# Patient Record
Sex: Female | Born: 1945
Health system: Southern US, Community
[De-identification: ages and names within clinical notes are randomized; demographics above are authoritative.]

## PROBLEM LIST (undated history)

## (undated) DIAGNOSIS — M1990 Unspecified osteoarthritis, unspecified site: Secondary | ICD-10-CM

## (undated) DIAGNOSIS — IMO0002 Reserved for concepts with insufficient information to code with codable children: Secondary | ICD-10-CM

## (undated) DIAGNOSIS — N63 Unspecified lump in unspecified breast: Secondary | ICD-10-CM

## (undated) DIAGNOSIS — M810 Age-related osteoporosis without current pathological fracture: Secondary | ICD-10-CM

## (undated) HISTORY — DX: Unspecified lump in unspecified breast: N63.0

## (undated) HISTORY — PX: COLONOSCOPY: SHX174

## (undated) HISTORY — DX: Age-related osteoporosis without current pathological fracture: M81.0

## (undated) HISTORY — PX: BREAST SURGERY: SHX581

## (undated) HISTORY — DX: Reserved for concepts with insufficient information to code with codable children: IMO0002

## (undated) HISTORY — PX: WISDOM TOOTH EXTRACTION: SHX21

## (undated) HISTORY — PX: POLYPECTOMY: SHX149

---

## 1970-07-29 HISTORY — PX: OTHER SURGICAL HISTORY: SHX169

## 1979-03-30 HISTORY — PX: NASAL SEPTUM SURGERY: SHX37

## 1997-12-08 ENCOUNTER — Other Ambulatory Visit: Admission: RE | Admit: 1997-12-08 | Discharge: 1997-12-08 | Payer: Self-pay | Admitting: Obstetrics and Gynecology

## 1999-03-01 ENCOUNTER — Other Ambulatory Visit: Admission: RE | Admit: 1999-03-01 | Discharge: 1999-03-01 | Payer: Self-pay | Admitting: Obstetrics and Gynecology

## 1999-04-10 ENCOUNTER — Other Ambulatory Visit: Admission: RE | Admit: 1999-04-10 | Discharge: 1999-04-10 | Payer: Self-pay | Admitting: Internal Medicine

## 2000-03-04 ENCOUNTER — Other Ambulatory Visit: Admission: RE | Admit: 2000-03-04 | Discharge: 2000-03-04 | Payer: Self-pay | Admitting: Obstetrics and Gynecology

## 2001-03-11 ENCOUNTER — Other Ambulatory Visit: Admission: RE | Admit: 2001-03-11 | Discharge: 2001-03-11 | Payer: Self-pay | Admitting: Obstetrics and Gynecology

## 2002-03-12 ENCOUNTER — Other Ambulatory Visit: Admission: RE | Admit: 2002-03-12 | Discharge: 2002-03-12 | Payer: Self-pay | Admitting: Obstetrics and Gynecology

## 2003-03-14 ENCOUNTER — Other Ambulatory Visit: Admission: RE | Admit: 2003-03-14 | Discharge: 2003-03-14 | Payer: Self-pay | Admitting: Obstetrics and Gynecology

## 2004-03-14 ENCOUNTER — Other Ambulatory Visit: Admission: RE | Admit: 2004-03-14 | Discharge: 2004-03-14 | Payer: Self-pay | Admitting: Obstetrics and Gynecology

## 2005-04-19 ENCOUNTER — Other Ambulatory Visit: Admission: RE | Admit: 2005-04-19 | Discharge: 2005-04-19 | Payer: Self-pay | Admitting: Obstetrics and Gynecology

## 2007-05-29 ENCOUNTER — Other Ambulatory Visit: Admission: RE | Admit: 2007-05-29 | Discharge: 2007-05-29 | Payer: Self-pay | Admitting: Obstetrics and Gynecology

## 2008-06-03 ENCOUNTER — Other Ambulatory Visit: Admission: RE | Admit: 2008-06-03 | Discharge: 2008-06-03 | Payer: Self-pay | Admitting: Obstetrics and Gynecology

## 2009-04-11 ENCOUNTER — Encounter (INDEPENDENT_AMBULATORY_CARE_PROVIDER_SITE_OTHER): Payer: Self-pay | Admitting: *Deleted

## 2009-05-19 ENCOUNTER — Ambulatory Visit: Payer: Self-pay | Admitting: Internal Medicine

## 2009-06-02 ENCOUNTER — Ambulatory Visit: Payer: Self-pay | Admitting: Internal Medicine

## 2009-06-02 ENCOUNTER — Encounter: Payer: Self-pay | Admitting: Internal Medicine

## 2009-06-05 ENCOUNTER — Encounter: Payer: Self-pay | Admitting: Internal Medicine

## 2014-03-31 ENCOUNTER — Encounter: Payer: Self-pay | Admitting: Internal Medicine

## 2014-04-07 ENCOUNTER — Encounter: Payer: Self-pay | Admitting: Internal Medicine

## 2014-04-18 ENCOUNTER — Encounter: Payer: Self-pay | Admitting: Obstetrics and Gynecology

## 2014-04-18 ENCOUNTER — Ambulatory Visit (INDEPENDENT_AMBULATORY_CARE_PROVIDER_SITE_OTHER): Payer: Medicare Other | Admitting: Obstetrics and Gynecology

## 2014-04-18 VITALS — BP 118/70 | HR 60 | Resp 14 | Ht 64.5 in | Wt 127.8 lb

## 2014-04-18 DIAGNOSIS — N950 Postmenopausal bleeding: Secondary | ICD-10-CM

## 2014-04-18 NOTE — Progress Notes (Signed)
Patient ID: Christine Romero, female   DOB: 06/30/1946, 68 y.o.   MRN: [id number] GYNECOLOGY VISIT  PCP:  Lavone Orn, MD  Referring provider:   HPI: 68 y.o.   Married  caucasian  female   [id number] with Patient's last menstrual period was 07/30/1983.   here for postmenopausal bleeding. Bled most of September and then became heavy on 04/09/14.  Seen at [facility] walk in clinic and was referred to this office.  Hgb 14 on 04/09/14. Patient takes HRT - Provera 5 mg days 1 - 13 and Estradiol 1 mg daily.  Taking HRT since her 41s. No prior bleeding in her menopause.  Denies pain.  No prior history of abnormal paps or mammograms.  No known fibroids.   GYNECOLOGIC HISTORY: Patient's last menstrual period was 07/30/1983. Sexually active:  no Partner preference: female Contraception:  postmenopausal Menopausal hormone therapy: Provera 5mg , Estradiol 0.5mg  DES exposure:no  Blood transfusions: no   Sexually transmitted diseases:   no GYN procedures and prior surgeries: no  Last mammogram: ?2014 [facility].               Last pap and high risk HPV testing:  4-6 years ago:wnl History of abnormal pap smear: no   OB History   Grav Para Term Preterm Abortions TAB SAB Ect Mult Living   3 3 3       3        LIFESTYLE: Exercise:  Gardening, walking            Tobacco:   no Alcohol:     2 drinks per month Drug use:  no  There are no active problems to display for this patient.   Past Medical History  Diagnosis Date  . Dyspareunia   . Osteoporosis     Past Surgical History  Procedure Laterality Date  . Breast surgery      breast augmentation  . Nasal septum surgery  1980's    Current Outpatient Prescriptions  Medication Sig Dispense Refill  . calcium carbonate (OS-CAL) 600 MG TABS tablet Take 600 mg by mouth 2 (two) times daily with a meal.      . estradiol (ESTRACE) 0.5 MG tablet Take 0.5 mg by mouth daily.      . Multiple Vitamin (MULTIVITAMIN) capsule Take 1 capsule by mouth daily.       . medroxyPROGESTERone (PROVERA) 5 MG tablet Take 5 mg by mouth as directed. Day 1-13 of each month      . valACYclovir (VALTREX) 1000 MG tablet Take 1 g by mouth as needed. Takes as needed for oral cold sores       No current facility-administered medications for this visit.     ALLERGIES: Pentazocine lactate  Family History  Problem Relation Age of Onset  . Stroke Mother 79    dec  . Diabetes Mother   . Cancer Father 45    dec colon ca  . Other Brother 30    dec-MVA  . Multiple sclerosis Sister   . Multiple sclerosis Sister   . Colitis Sister   . Stroke Maternal Aunt     History   Social History  . Marital Status: Married    Spouse Name: N/A    Number of Children: N/A  . Years of Education: N/A   Occupational History  . Not on file.   Social History Main Topics  . Smoking status: Never Smoker   . Smokeless tobacco: Not on file  . Alcohol Use: Yes  Comment: 2 drinks per month  . Drug Use: No  . Sexual Activity: Yes    Partners: Male    Birth Control/ Protection: Post-menopausal     Comment: postmenopausal   Other Topics Concern  . Not on file   Social History Narrative  . No narrative on file    ROS:  Pertinent items are noted in HPI.  PHYSICAL EXAMINATION:    BP 118/70  Pulse 60  Resp 14  Ht 5' 4.5" (1.638 m)  Wt 127 lb 12.8 oz (57.97 kg)  BMI 21.61 kg/m2  LMP 07/30/1983   Wt Readings from Last 3 Encounters:  04/18/14 127 lb 12.8 oz (57.97 kg)     Ht Readings from Last 3 Encounters:  04/18/14 5' 4.5" (1.638 m)    General appearance: alert, cooperative and appears stated age Head: Normocephalic, without obvious abnormality, atraumatic Neck: no adenopathy, supple, symmetrical, trachea midline and thyroid not enlarged, symmetric, no tenderness/mass/nodules Lungs: clear to auscultation bilaterally Breasts:  Consistent with bilateral implants, Inspection negative, No nipple retraction or dimpling, No nipple discharge or bleeding, No  axillary or supraclavicular adenopathy, Normal to palpation without dominant masses Heart: regular rate and rhythm Abdomen: soft, non-tender; no masses,  no organomegaly Extremities: extremities normal, atraumatic, no cyanosis or edema Skin: Skin color, texture, turgor normal. No rashes or lesions Lymph nodes: Cervical, supraclavicular, and axillary nodes normal. No abnormal inguinal nodes palpated Neurologic: Grossly normal  Pelvic: External genitalia:  no lesions              Urethra:  normal appearing urethra with no masses, tenderness or lesions              Bartholins and Skenes: normal                 Vagina: normal appearing vagina with normal color and discharge, no lesions              Cervix: normal appearance                 Bimanual Exam:  Uterus:  uterus is normal size, shape, consistency and nontender                                      Adnexa: normal adnexa in size, nontender and no masses                                      Rectovaginal: Confirms                                      Anus:  normal sphincter tone, no lesions  Procedure - endometrial biopsy Consent performed. Speculum place in vagina.  Sterile prep of cervix with betadine.  Pipelle placed to   7       cm without difficulty twice. Tissue obtained and sent to pathology. Speculum removed.  No complications.  Given Coke after procedure.  Declined ibuprofen.   ASSESSMENT  Postmenopausal bleeding. On HRT - cyclic monthly.   PLAN  Pap today.  Endometrial biopsy. Return for pelvic ultrasound.    An After Visit Summary was printed and given to the patient.  30 minutes face to face time of which over 50% was spent in  counseling.

## 2014-04-18 NOTE — Patient Instructions (Signed)
Endometrial Biopsy, Care After Refer to this sheet in the next few weeks. These instructions provide you with information on caring for yourself after your procedure. Your health care provider may also give you more specific instructions. Your treatment has been planned according to current medical practices, but problems sometimes occur. Call your health care provider if you have any problems or questions after your procedure. WHAT TO EXPECT AFTER THE PROCEDURE After your procedure, it is typical to have the following:  You may have mild cramping and a small amount of vaginal bleeding for a few days after the procedure. This is normal. HOME CARE INSTRUCTIONS  Only take over-the-counter or prescription medicine as directed by your health care provider.  Do not douche, use tampons, or have sexual intercourse until your health care provider approves.  Follow your health care provider's instructions regarding any activity restrictions, such as strenuous exercise or heavy lifting. SEEK MEDICAL CARE IF:  You have heavy bleeding or bleeding longer than 2 days after the procedure.  You have bad smelling drainage from your vagina.  You have a fever and chills.  Youhave severe lower stomach (abdominal) pain. SEEK IMMEDIATE MEDICAL CARE IF:  You have severe cramps in your stomach or back.  You pass large blood clots.  Your bleeding increases.  You become weak or lightheaded, or you pass out. Document Released: 05/05/2013 Document Reviewed: 05/05/2013 ExitCare Patient Information 2015 [employer]. This information is not intended to replace advice given to you by your health care provider. Make sure you discuss any questions you have with your health care provider.  

## 2014-04-20 LAB — IPS OTHER TISSUE BIOPSY

## 2014-04-20 LAB — IPS PAP SMEAR ONLY

## 2014-04-21 ENCOUNTER — Encounter: Payer: Self-pay | Admitting: Obstetrics and Gynecology

## 2014-04-21 ENCOUNTER — Telehealth: Payer: Self-pay | Admitting: Obstetrics and Gynecology

## 2014-04-21 ENCOUNTER — Ambulatory Visit (INDEPENDENT_AMBULATORY_CARE_PROVIDER_SITE_OTHER): Payer: Medicare Other

## 2014-04-21 ENCOUNTER — Ambulatory Visit (INDEPENDENT_AMBULATORY_CARE_PROVIDER_SITE_OTHER): Payer: Medicare Other | Admitting: Obstetrics and Gynecology

## 2014-04-21 VITALS — BP 100/60 | HR 60 | Ht 64.5 in | Wt 127.0 lb

## 2014-04-21 DIAGNOSIS — N950 Postmenopausal bleeding: Secondary | ICD-10-CM

## 2014-04-21 DIAGNOSIS — D3910 Neoplasm of uncertain behavior of unspecified ovary: Secondary | ICD-10-CM

## 2014-04-21 NOTE — Progress Notes (Signed)
Subjective  Patient is here for pelvic ultrasound for postmenopausal bleeding.  Patient having post menopausal bleeding most of the month of September.  Was never clotting.  Patient is taking progesterone 13 days per month.  Takes estrogen daily.  Been on this regimen since her 49s.  Going to [place] on May 02, 2014.  Returns May 11, 2014.   EMB 04/18/14 - benign endometrium.   Objective  See pelvic ultrasound below - images and reports reviewed with patient.   Uterus - EMS 4.37 mm. Uterus with no myometrial masses.  Possible endocervical mass 8 x 6 mm.  Left ovary with collapsed and calcified corpus luteum cyst.  No free fluid.     Assessment  Postmenopausal bleeding.  Possible endocervical mass. Left corpus luteum cyst.   Plan  CA125.  Proceed with hysteroscopy with dilation and curettage.  Risks, benefits, and alternatives discussed with patient who wishes to proceed. Discussed risks which include but are not limited to  bleeding, infection, damage to surrounding organs, uterine perforation requiring laparoscopy and hospital observation, reaction to anesthesia, pneumonia, DVT, PE.  Will do after returns from [place].  We discussed the pros and cons of doing surgery prior to or after travel.  After visit summary to patient.   25 minutes face to face time of which over 50% was spent in counseling.

## 2014-04-21 NOTE — Telephone Encounter (Signed)
Spoke with patient. Advised will be responsible for $93.08 per [place]. Patient is agreeable and verbalizes understanding.  Routing to provider for final review. Patient agreeable to disposition. Will close encounter

## 2014-04-21 NOTE — Telephone Encounter (Signed)
Spoke with patient. Advised patient that she is to return for a PUS but it has not yet been precerted. Patient would like to schedule for today and have a call back about benefits. "I do not want to wait until next week." Appointment scheduled for today at 10am with 10:30am consult with Dr.Silva. Patient agreeable to date and time. Advised will have billing check coverage and we will return call with further information. Patient is agreeable.  Cc: Christine Romero for precert

## 2014-04-21 NOTE — Telephone Encounter (Signed)
Patient is waiting on a return call from [place]. Patient thinks she is supposed to return for another office visit today. No appointment scheduled.

## 2014-04-21 NOTE — Patient Instructions (Signed)
We will call to schedule a hysteroscopic polypectomy with dilation and curettage.

## 2014-04-21 NOTE — Telephone Encounter (Signed)
Left message to call [employer] at [phone].  Patient is to return for PUS. PUS has not yet been precerted. Needs to be scheduled.

## 2014-04-22 ENCOUNTER — Telehealth: Payer: Self-pay | Admitting: Obstetrics and Gynecology

## 2014-04-22 LAB — CA 125: CA 125: 12 U/mL (ref <35)

## 2014-04-22 NOTE — Telephone Encounter (Signed)
Patient calling re: D&C after 05/11/14 plan. She is "having second thoughts about waiting that long" and wants to speak with nurse for advice.

## 2014-04-22 NOTE — Telephone Encounter (Signed)
I now see her normal CA 125 result which I can provide to her.

## 2014-04-22 NOTE — Telephone Encounter (Signed)
Routing to Sally Yeakley, RN for surgery scheduling. 

## 2014-04-22 NOTE — Telephone Encounter (Signed)
Reviewed with Dr Quincy Simmonds. Patient does not need to cancel trip. Can offer 04-26-14 if desires. Otherwise, ok to wait until 05-16-14 after trip. Patient advised of normal CA125 results. Decides would prefer to wait until 05-16-14. Advised will schedule and call her back to confirm.

## 2014-04-22 NOTE — Telephone Encounter (Signed)
Return call to patient. Has been thinking overnight and is now worried that she should wait until return from trip to [place].  Wants to speak with Dr Quincy Simmonds to see if she should cancel trip. Advised dr Quincy Simmonds is with patients. I feel like if she felt it was that urgent, she would have told her that yesterday but will review with her and call her back. Advised we have minimal, if any, availability for surgery before 05-16-14. Please advise.

## 2014-04-23 NOTE — Telephone Encounter (Signed)
OK to wait until after patient returns from her trip to do her surgery.

## 2014-04-25 ENCOUNTER — Telehealth: Payer: Self-pay

## 2014-04-25 NOTE — Telephone Encounter (Signed)
Message copied by Robley Fries on Mon Apr 25, 2014 10:56 AM ------      Message from: Judeth Horn DE Berton Lan, BROOK E      Created: Fri Apr 22, 2014  1:16 PM       Triage,            Please let patient know that the CA125 is totally normal.       This was done due to a cyst seen on the ovary yesterday at her ultrasound for postmenopausal bleeding.       She will have upcoming hysteroscopy with dilation and curettage for an endocervical polyp.             Cc- Lamont Snowball ------

## 2014-04-25 NOTE — Telephone Encounter (Signed)
Patient notified of results.//kn 

## 2014-04-25 NOTE — Telephone Encounter (Signed)
Pt is calling kelly back

## 2014-04-25 NOTE — Telephone Encounter (Signed)
Lmtcb//kn 

## 2014-04-29 NOTE — Telephone Encounter (Signed)
Call to patient. Confirmed surgery date of 05-16-14 at 1100 at [facility]. Surgery instruction sheet reviewed and printed copy mailed. Surgery appointments scheduled.  Patient states PCP advised her to take ASA 81 mg on her flights to and from [place]. Confirmed with Dr Quincy Simmonds that ASA products are not recommended for 14 days prior to surgery. Recommended compression stockings and leg exercises during flights. Recommend she stop all herbal products that she uses for sleep, diphenhydramine is ok but should not take melatonin ot valerian root.  Routing to provider for final review. Patient agreeable to disposition. Will close encounter

## 2014-05-10 NOTE — Patient Instructions (Addendum)
   Your procedure is scheduled on:  Monday, Oct 19  Enter through the Main Entrance of [facility] at: 6 AM Pick up the phone at the desk and dial [phone] and inform us of your arrival.  Please call this number if you have any problems the morning of surgery: [phone]  Remember: Do not eat or drink after midnight: Sunday Take these medicines the morning of surgery with a SIP OF WATER:  None  Do not wear jewelry, make-up, or FINGER nail polish No metal in your hair or on your body. Do not wear lotions, powders, perfumes.  You may wear deodorant.  Do not bring valuables to the hospital. Contacts, dentures or bridgework may not be worn into surgery.  Patients discharged on the day of surgery will not be allowed to drive home.  Home with husband Christine Romero

## 2014-05-12 ENCOUNTER — Other Ambulatory Visit: Payer: Self-pay

## 2014-05-12 ENCOUNTER — Encounter (HOSPITAL_COMMUNITY)
Admission: RE | Admit: 2014-05-12 | Discharge: 2014-05-12 | Disposition: A | Discharge status: Home / Self Care | Payer: Medicare Other | Source: Ambulatory Visit | Attending: Obstetrics and Gynecology | Admitting: Obstetrics and Gynecology

## 2014-05-12 ENCOUNTER — Telehealth: Payer: Self-pay | Admitting: *Deleted

## 2014-05-12 ENCOUNTER — Encounter (HOSPITAL_COMMUNITY): Payer: Self-pay | Admitting: Pharmacist

## 2014-05-12 ENCOUNTER — Encounter (HOSPITAL_COMMUNITY): Payer: Self-pay

## 2014-05-12 DIAGNOSIS — Z01812 Encounter for preprocedural laboratory examination: Secondary | ICD-10-CM | POA: Insufficient documentation

## 2014-05-12 HISTORY — DX: Unspecified osteoarthritis, unspecified site: M19.90

## 2014-05-12 LAB — CBC
HCT: 39.3 % (ref 36.0–46.0)
HEMOGLOBIN: 13.7 g/dL (ref 12.0–15.0)
MCH: 30.4 pg (ref 26.0–34.0)
MCHC: 34.9 g/dL (ref 30.0–36.0)
MCV: 87.3 fL (ref 78.0–100.0)
PLATELETS: 211 10*3/uL (ref 150–400)
RBC: 4.5 MIL/uL (ref 3.87–5.11)
RDW: 12.4 % (ref 11.5–15.5)
WBC: 8.4 10*3/uL (ref 4.0–10.5)

## 2014-05-12 LAB — BASIC METABOLIC PANEL
Anion gap: 11 (ref 5–15)
BUN: 11 mg/dL (ref 6–23)
CALCIUM: 9.4 mg/dL (ref 8.4–10.5)
CO2: 27 mEq/L (ref 19–32)
Chloride: 104 mEq/L (ref 96–112)
Creatinine, Ser: 0.85 mg/dL (ref 0.50–1.10)
GFR, EST AFRICAN AMERICAN: 79 mL/min — AB (ref >90)
GFR, EST NON AFRICAN AMERICAN: 68 mL/min — AB (ref >90)
Glucose, Bld: 100 mg/dL — ABNORMAL HIGH (ref 70–99)
Potassium: 3.9 mEq/L (ref 3.7–5.3)
Sodium: 142 mEq/L (ref 137–147)

## 2014-05-12 NOTE — Telephone Encounter (Signed)
Patient is scheduled for colonoscopy on 06-08-14 and wanted to be sure surgery date on 05-31-14 was ok with scheduled colonoscopy. Advised that 24-48 hour recovery should be all that is needed after this procedure but will review with Dr Quincy Simmonds and confirm. Please advise.

## 2014-05-12 NOTE — Telephone Encounter (Signed)
Arbie Cookey at [facility]. Patient is there for pre op appointment and has cold symptoms and laryngitis and anesthsia feels her surgery should be canceled for at least two weeks. She has been traveling and is not well enough to undergo an elective surgical procedure per Dr Rudean Curt. They will complete appointment now and if we reschedule procedure within 30 days she will not need another pre-op. Advised we will have to look at surgery schedule and call patient back. Novemeber 3rd may be an option if she is well enough.

## 2014-05-12 NOTE — Pre-Procedure Instructions (Signed)
Spoke with Dr. Rudean Curt, patient has just arrived back from vacation in [place].  Patient has had cold sx, cough and laryngitis for the past 3-4 days.  No fever.  Dr Glennon Mac wants patient to wait 2 weeks for her surgery (an elective surgery).  Called Dr Elza Rafter office and spoke with Gay Filler who will reschedule surgery 2 weeks out.

## 2014-05-12 NOTE — Telephone Encounter (Signed)
Pts husband is calling to speak with sally about rescheduling the surgery due to pt being sick.

## 2014-05-12 NOTE — Telephone Encounter (Signed)
The two procedure dates should be fine for the patient.

## 2014-05-12 NOTE — Telephone Encounter (Signed)
Call to patient, voice barely audible so she is on speaker phone with husband to answer questions for her. Agrees to new date of 05-31-14 at 1200. Surgical appointments rescheduled based on new date. Reviewed surgical instruction sheet and will mail printed copy with new dates. Discussed may drink herbal tea and have motrin for her current cold symptoms until 05-19-14 (2 weeks before new procedure). Encouraged rest and hydration, wished her speedy recovery.   Routing to provider for final review. Patient agreeable to disposition. Will close encounter

## 2014-05-12 NOTE — Telephone Encounter (Signed)
First available date to reschedule is 05-31-14. Case reschedule to 05-31-14 at 1200.  Call to patient, LMTCB.

## 2014-05-13 ENCOUNTER — Ambulatory Visit: Payer: Medicare Other | Admitting: Obstetrics and Gynecology

## 2014-05-13 ENCOUNTER — Other Ambulatory Visit: Payer: Self-pay

## 2014-05-13 NOTE — Telephone Encounter (Signed)
Call to patient. Notified that Dr Quincy Simmonds reviewed dates of both procedures and this is not a conflict.

## 2014-05-18 NOTE — Pre-Procedure Instructions (Signed)
Called patient at home.  Re-instructed patient with new surgery date and time change.  Surgery now scheduled for 05/31/14 at 12 noon.  Patient instructed to be here at 1030 AM.  No solid foods after midnight Monday night, 05/30/14.  Clear liquids til 8 AM Tuesday, day of surgery.  Nothing by mouth after 8 AM Tuesday, DOS, 05/31/14.

## 2014-05-25 ENCOUNTER — Ambulatory Visit (AMBULATORY_SURGERY_CENTER): Payer: Self-pay | Admitting: *Deleted

## 2014-05-25 VITALS — Ht 64.5 in | Wt 127.0 lb

## 2014-05-25 DIAGNOSIS — Z86010 Personal history of colonic polyps: Secondary | ICD-10-CM

## 2014-05-25 NOTE — Progress Notes (Signed)
No egg or soy allergy. ewm No home 02 use. ewm No diet pills, no blood thinners. ewm No issues with past sedation. ewm emmi video to pt's e mail. ewm Pt wants to do miralax prep as her husband is doing that with his colon the same time.  ewm

## 2014-05-26 ENCOUNTER — Ambulatory Visit: Payer: Medicare Other | Admitting: Obstetrics and Gynecology

## 2014-05-26 ENCOUNTER — Encounter: Payer: Self-pay | Admitting: Obstetrics and Gynecology

## 2014-05-26 ENCOUNTER — Ambulatory Visit (INDEPENDENT_AMBULATORY_CARE_PROVIDER_SITE_OTHER): Payer: Medicare Other | Admitting: Obstetrics and Gynecology

## 2014-05-26 VITALS — BP 130/76 | HR 66 | Ht 64.5 in | Wt 129.8 lb

## 2014-05-26 DIAGNOSIS — N950 Postmenopausal bleeding: Secondary | ICD-10-CM

## 2014-05-26 NOTE — Progress Notes (Signed)
Patient ID: Christine Romero, female   DOB: [birth year], 68 y.o.   MRN: [id number] GYNECOLOGY  VISIT   HPI: 68 y.o.   Married  Caucasian  female   [phone] with Patient's last menstrual period was 07/30/1983.   here for evaluation of upcoming surgery.   Patient with postmenopausal bleeding.  Bleeding has resolved.   Patient had post menopausal bleeding most of the month of September. Was never clotting.  Patient is taking progesterone 13 days per month.  Takes estrogen daily.  Been on this regimen since her 32s.   EMB 04/18/14 - benign endometrium.   See pelvic ultrasound 04/21/14: Uterus - EMS 4.37 mm. Uterus with no myometrial masses. Possible endocervical mass 8 x 6 mm. Left ovary with collapsed and calcified corpus luteum cyst.  No free fluid.   Normal CA125. Normal pap.  Patient just recovering from bronchitis.  Taking Mucinex and Tussionex.  GYNECOLOGIC HISTORY: Patient's last menstrual period was 07/30/1983. Contraception: postmenopausal   Menopausal hormone therapy: Estradiol 1 mg, Provera 5 mg.         OB History   Grav Para Term Preterm Abortions TAB SAB Ect Mult Living   3 3 3       3          Patient Active Problem List   Diagnosis Date Noted  . Postmenopausal bleeding 04/18/2014    Past Medical History  Diagnosis Date  . Dyspareunia   . Osteoporosis   . SVD (spontaneous vaginal delivery)     x 3  . Arthritis     hands    Past Surgical History  Procedure Laterality Date  . Breast surgery      breast augmentation  . Nasal septum surgery  1980's  . Wisdom tooth extraction    . Colonoscopy    . Polypectomy    . Gardners duct surgery  1972    Current Outpatient Prescriptions  Medication Sig Dispense Refill  . chlorpheniramine-HYDROcodone (TUSSIONEX) 10-8 MG/5ML LQCR Take 5 mLs by mouth every 12 (twelve) hours as needed for cough.      . diphenhydrAMINE (BENADRYL) 25 mg capsule Take 25-50 mg by mouth at bedtime as needed for sleep.      Marland Kitchen estradiol  (ESTRACE) 1 MG tablet Take 1 mg by mouth daily.      . medroxyPROGESTERone (PROVERA) 5 MG tablet Take 5 mg by mouth as directed. Day 1-13 of each month      . Phenylephrine-DM-GG-APAP (MUCINEX SINUS-MAX) 5-10-200-325 MG CAPS Take 1 capsule by mouth as needed.      . calcium carbonate (OS-CAL) 600 MG TABS tablet Take 600 mg by mouth 2 (two) times daily with a meal.      . DM-Doxylamine-Acetaminophen (NYQUIL COLD & FLU PO) Take 30 mLs by mouth every 4 (four) hours as needed (cold & flu symptoms).      . Multiple Vitamins-Minerals (MULTIVITAMIN WITH MINERALS) tablet Take 1 tablet by mouth daily.      . valACYclovir (VALTREX) 1000 MG tablet Take 1 g by mouth as needed. Takes as needed for oral cold sores       No current facility-administered medications for this visit.     ALLERGIES: Talwin  Family History  Problem Relation Age of Onset  . Stroke Mother 69    dec  . Diabetes Mother   . Cancer Father 73    dec colon ca  . Colon cancer Father   . Other Brother 30    dec-MVA  .  Multiple sclerosis Sister   . Multiple sclerosis Sister   . Colitis Sister   . Stroke Maternal Aunt   . Rectal cancer Neg Hx   . Stomach cancer Neg Hx     History   Social History  . Marital Status: Married    Spouse Name: N/A    Number of Children: N/A  . Years of Education: N/A   Occupational History  . Not on file.   Social History Main Topics  . Smoking status: Current Some Day Smoker -- 0.25 packs/day    Types: Cigarettes  . Smokeless tobacco: Never Used  . Alcohol Use: Yes     Comment: socially  . Drug Use: No  . Sexual Activity: Yes    Partners: Male    Birth Control/ Protection: Post-menopausal     Comment: postmenopausal   Other Topics Concern  . Not on file   Social History Narrative  . No narrative on file    ROS:  Pertinent items are noted in HPI.  PHYSICAL EXAMINATION:    BP 130/76  Pulse 66  Ht 5' 4.5" (1.638 m)  Wt 129 lb 12.8 oz (58.877 kg)  BMI 21.94 kg/m2  LMP  07/30/1983     General appearance: alert, cooperative and appears stated age Lungs: clear to auscultation bilaterally Heart: regular rate and rhythm Abdomen: soft, non-tender; no masses,  no organomegaly No abnormal inguinal nodes palpated  Pelvic: External genitalia:  no lesions              Urethra:  normal appearing urethra with no masses, tenderness or lesions              Bartholins and Skenes: normal                 Vagina: normal appearing vagina with normal color and discharge, no lesions              Cervix: normal appearance                   Bimanual Exam:  Uterus:  uterus is normal size, shape, consistency and nontender                                      Adnexa: normal adnexa in size, nontender and no masses                                     ASSESSMENT  Postmenopausal bleeding.  Calcified left ovary.  Normal CA125.  PLAN  Proceed with hysteroscopy with polypectomy, dilation and curettage. Risks, benefits, and alternatives discussed with the patient who wishes to proceed.  Pelvic ultrasound in January 2016 to recheck ovary.    An After Visit Summary was printed and given to the patient.  _15_____ minutes face to face time of which over 50% was spent in counseling.

## 2014-05-26 NOTE — Patient Instructions (Signed)
I will see you next week for surgery!

## 2014-05-30 ENCOUNTER — Encounter: Payer: Self-pay | Admitting: Obstetrics and Gynecology

## 2014-05-30 NOTE — H&P (Signed)
Brook E Amundson de Berton Lan, MD at 05/26/2014 12:44 PM       Status: Signed        Expand All Collapse All   Patient ID: Christine Romero, female   DOB: [birth year], 68 y.o.   MRN: [id number] GYNECOLOGY  VISIT   HPI: 68 y.o.   Married  Caucasian  female    [phone] with Patient's last menstrual period was 07/30/1983.    here for evaluation of upcoming surgery.    Patient with postmenopausal bleeding.   Bleeding has resolved.   Patient had post menopausal bleeding most of the month of September. Was never clotting.   Patient is taking progesterone 13 days per month.   Takes estrogen daily.   Been on this regimen since her 15s.   EMB 04/18/14 - benign endometrium.   See pelvic ultrasound 04/21/14: Uterus - EMS 4.37 mm. Uterus with no myometrial masses. Possible endocervical mass 8 x 6 mm. Left ovary with collapsed and calcified corpus luteum cyst.   No free fluid.   Normal CA125. Normal pap.  Patient just recovering from bronchitis.   Taking Mucinex and Tussionex.  GYNECOLOGIC HISTORY: Patient's last menstrual period was 07/30/1983. Contraception: postmenopausal    Menopausal hormone therapy: Estradiol 1 mg, Provera 5 mg.           OB History     Grav  Para  Term  Preterm  Abortions  TAB  SAB  Ect  Mult  Living     3  3  3              3              Patient Active Problem List     Diagnosis  Date Noted   .  Postmenopausal bleeding  04/18/2014       Past Medical History   Diagnosis  Date   .  Dyspareunia     .  Osteoporosis     .  SVD (spontaneous vaginal delivery)         x 3   .  Arthritis         hands       Past Surgical History   Procedure  Laterality  Date   .  Breast surgery           breast augmentation   .  Nasal septum surgery    1980's   .  Wisdom tooth extraction       .  Colonoscopy       .  Polypectomy       .  [place] duct surgery    1972       Current Outpatient Prescriptions   Medication  Sig  Dispense  Refill   .   chlorpheniramine-HYDROcodone (TUSSIONEX) 10-8 MG/5ML LQCR  Take 5 mLs by mouth every 12 (twelve) hours as needed for cough.         .  diphenhydrAMINE (BENADRYL) 25 mg capsule  Take 25-50 mg by mouth at bedtime as needed for sleep.         Marland Kitchen  estradiol (ESTRACE) 1 MG tablet  Take 1 mg by mouth daily.         .  medroxyPROGESTERone (PROVERA) 5 MG tablet  Take 5 mg by mouth as directed. Day 1-13 of each month         .  Phenylephrine-DM-GG-APAP (MUCINEX SINUS-MAX) 5-10-200-325 MG CAPS  Take 1 capsule by mouth as needed.         Marland Kitchen  calcium carbonate (OS-CAL) 600 MG TABS tablet  Take 600 mg by mouth 2 (two) times daily with a meal.         .  DM-Doxylamine-Acetaminophen (NYQUIL COLD & FLU PO)  Take 30 mLs by mouth every 4 (four) hours as needed (cold & flu symptoms).         .  Multiple Vitamins-Minerals (MULTIVITAMIN WITH MINERALS) tablet  Take 1 tablet by mouth daily.         .  valACYclovir (VALTREX) 1000 MG tablet  Take 1 g by mouth as needed. Takes as needed for oral cold sores            No current facility-administered medications for this visit.      ALLERGIES: Talwin    Family History   Problem  Relation  Age of Onset   .  Stroke  Mother  73       dec   .  Diabetes  Mother     .  Cancer  Father  36       dec colon ca   .  Colon cancer  Father     .  Other  Brother  30       dec-MVA   .  Multiple sclerosis  Sister     .  Multiple sclerosis  Sister     .  Colitis  Sister     .  Stroke  Maternal Aunt     .  Rectal cancer  Neg Hx     .  Stomach cancer  Neg Hx         History      Social History   .  Marital Status:  Married       Spouse Name:  N/A       Number of Children:  N/A   .  Years of Education:  N/A      Occupational History   .  Not on file.      Social History Main Topics   .  Smoking status:  Current Some Day Smoker -- 0.25 packs/day       Types:  Cigarettes   .  Smokeless tobacco:  Never Used   .  Alcohol Use:  Yes         Comment: socially   .  Drug  Use:  No   .  Sexual Activity:  Yes       Partners:  Male       Birth Control/ Protection:  Post-menopausal         Comment: postmenopausal      Other Topics  Concern   .  Not on file      Social History Narrative   .  No narrative on file     ROS:  Pertinent items are noted in HPI.  PHYSICAL EXAMINATION:    BP 130/76  Pulse 66  Ht 5' 4.5" (1.638 m)  Wt 129 lb 12.8 oz (58.877 kg)  BMI 21.94 kg/m2  LMP 07/30/1983     General appearance: alert, cooperative and appears stated age Lungs: clear to auscultation bilaterally Heart: regular rate and rhythm Abdomen: soft, non-tender; no masses,  no organomegaly No abnormal inguinal nodes palpated  Pelvic: External genitalia:  no lesions              Urethra:  normal appearing urethra with no masses, tenderness or lesions  Bartholins and Skenes: normal                  Vagina: normal appearing vagina with normal color and discharge, no lesions              Cervix: normal appearance                    Bimanual Exam:  Uterus:  uterus is normal size, shape, consistency and nontender                                      Adnexa: normal adnexa in size, nontender and no masses                                     ASSESSMENT  Postmenopausal bleeding.   Calcified left ovary.  Normal CA125.  PLAN  Proceed with hysteroscopy with polypectomy, dilation and curettage. Risks, benefits, and alternatives discussed with the patient who wishes to proceed.   Pelvic ultrasound in January 2016 to recheck ovary.      An After Visit Summary was printed and given to the patient.  _15_____ minutes face to face time of which over 50% was spent in counseling.

## 2014-05-31 ENCOUNTER — Ambulatory Visit (HOSPITAL_COMMUNITY): Payer: Medicare Other | Admitting: Anesthesiology

## 2014-05-31 ENCOUNTER — Ambulatory Visit (HOSPITAL_COMMUNITY)
Admission: RE | Admit: 2014-05-31 | Discharge: 2014-05-31 | Disposition: A | Discharge status: Home / Self Care | Payer: Medicare Other | Source: Ambulatory Visit | Attending: Obstetrics and Gynecology | Admitting: Obstetrics and Gynecology

## 2014-05-31 ENCOUNTER — Encounter (HOSPITAL_COMMUNITY)
Admission: RE | Disposition: A | Discharge status: Home / Self Care | Payer: Self-pay | Source: Ambulatory Visit | Attending: Obstetrics and Gynecology

## 2014-05-31 DIAGNOSIS — F17200 Nicotine dependence, unspecified, uncomplicated: Secondary | ICD-10-CM | POA: Diagnosis not present

## 2014-05-31 DIAGNOSIS — N941 Dyspareunia: Secondary | ICD-10-CM | POA: Insufficient documentation

## 2014-05-31 DIAGNOSIS — N950 Postmenopausal bleeding: Secondary | ICD-10-CM | POA: Diagnosis not present

## 2014-05-31 DIAGNOSIS — N840 Polyp of corpus uteri: Secondary | ICD-10-CM | POA: Insufficient documentation

## 2014-05-31 DIAGNOSIS — Z79899 Other long term (current) drug therapy: Secondary | ICD-10-CM | POA: Insufficient documentation

## 2014-05-31 DIAGNOSIS — M810 Age-related osteoporosis without current pathological fracture: Secondary | ICD-10-CM | POA: Diagnosis not present

## 2014-05-31 DIAGNOSIS — Z886 Allergy status to analgesic agent status: Secondary | ICD-10-CM | POA: Diagnosis not present

## 2014-05-31 DIAGNOSIS — M19049 Primary osteoarthritis, unspecified hand: Secondary | ICD-10-CM | POA: Insufficient documentation

## 2014-05-31 DIAGNOSIS — N841 Polyp of cervix uteri: Secondary | ICD-10-CM | POA: Diagnosis present

## 2014-05-31 HISTORY — PX: DILATATION & CURETTAGE/HYSTEROSCOPY WITH MYOSURE: SHX6511

## 2014-05-31 SURGERY — DILATATION & CURETTAGE/HYSTEROSCOPY WITH MYOSURE
Anesthesia: General | Site: Vagina

## 2014-05-31 MED ORDER — LIDOCAINE HCL (CARDIAC) 20 MG/ML IV SOLN
INTRAVENOUS | Status: AC
  Filled 2014-05-31: qty 5

## 2014-05-31 MED ORDER — MIDAZOLAM HCL 2 MG/2ML IJ SOLN
INTRAMUSCULAR | Status: AC
  Filled 2014-05-31: qty 2

## 2014-05-31 MED ORDER — CEFAZOLIN SODIUM-DEXTROSE 2-3 GM-% IV SOLR
INTRAVENOUS | Status: AC
  Filled 2014-05-31: qty 50

## 2014-05-31 MED ORDER — KETOROLAC TROMETHAMINE 30 MG/ML IJ SOLN
INTRAMUSCULAR | Status: DC | PRN
  Administered 2014-05-31: 15 mg via INTRAVENOUS

## 2014-05-31 MED ORDER — PROPOFOL 10 MG/ML IV EMUL
INTRAVENOUS | Status: AC
  Filled 2014-05-31: qty 20

## 2014-05-31 MED ORDER — LIDOCAINE HCL (CARDIAC) 20 MG/ML IV SOLN
INTRAVENOUS | Status: DC | PRN
  Administered 2014-05-31: 50 mg via INTRAVENOUS

## 2014-05-31 MED ORDER — LIDOCAINE-EPINEPHRINE 1 %-1:100000 IJ SOLN
INTRAMUSCULAR | Status: AC
  Filled 2014-05-31: qty 1

## 2014-05-31 MED ORDER — SODIUM CHLORIDE 0.9 % IR SOLN
Status: DC | PRN
  Administered 2014-05-31: 3000 mL

## 2014-05-31 MED ORDER — GLYCOPYRROLATE 0.2 MG/ML IJ SOLN
INTRAMUSCULAR | Status: AC
  Filled 2014-05-31: qty 1

## 2014-05-31 MED ORDER — LACTATED RINGERS IV SOLN: INTRAVENOUS | Status: DC

## 2014-05-31 MED ORDER — MIDAZOLAM HCL 2 MG/2ML IJ SOLN
INTRAMUSCULAR | Status: DC | PRN
  Administered 2014-05-31 (×2): 1 mg via INTRAVENOUS

## 2014-05-31 MED ORDER — DEXAMETHASONE SODIUM PHOSPHATE 10 MG/ML IJ SOLN
INTRAMUSCULAR | Status: DC | PRN
  Administered 2014-05-31: 4 mg via INTRAVENOUS

## 2014-05-31 MED ORDER — ACETAMINOPHEN 160 MG/5ML PO SOLN
975.0000 mg | Freq: Four times a day (QID) | ORAL | Status: DC | PRN
  Administered 2014-05-31: 975 mg via ORAL

## 2014-05-31 MED ORDER — LACTATED RINGERS IV SOLN
INTRAVENOUS | Status: DC
  Administered 2014-05-31: 11:00:00 via INTRAVENOUS

## 2014-05-31 MED ORDER — DEXAMETHASONE SODIUM PHOSPHATE 4 MG/ML IJ SOLN
INTRAMUSCULAR | Status: AC
  Filled 2014-05-31: qty 1

## 2014-05-31 MED ORDER — IBUPROFEN 800 MG PO TABS: 800.0000 mg | ORAL_TABLET | Freq: Three times a day (TID) | ORAL | Status: AC | PRN

## 2014-05-31 MED ORDER — ACETAMINOPHEN 160 MG/5ML PO SOLN
ORAL | Status: AC
  Filled 2014-05-31: qty 40.6

## 2014-05-31 MED ORDER — FENTANYL CITRATE 0.05 MG/ML IJ SOLN: 25.0000 ug | INTRAMUSCULAR | Status: DC | PRN

## 2014-05-31 MED ORDER — LIDOCAINE HCL 1 % IJ SOLN
INTRAMUSCULAR | Status: AC
  Filled 2014-05-31: qty 20

## 2014-05-31 MED ORDER — FENTANYL CITRATE 0.05 MG/ML IJ SOLN
INTRAMUSCULAR | Status: DC | PRN
  Administered 2014-05-31: 50 ug via INTRAVENOUS

## 2014-05-31 MED ORDER — KETOROLAC TROMETHAMINE 30 MG/ML IJ SOLN
INTRAMUSCULAR | Status: AC
  Filled 2014-05-31: qty 1

## 2014-05-31 MED ORDER — ONDANSETRON HCL 4 MG/2ML IJ SOLN
INTRAMUSCULAR | Status: DC | PRN
  Administered 2014-05-31: 4 mg via INTRAVENOUS

## 2014-05-31 MED ORDER — GLYCOPYRROLATE 0.2 MG/ML IJ SOLN
INTRAMUSCULAR | Status: DC | PRN
  Administered 2014-05-31: 0.1 mg via INTRAVENOUS

## 2014-05-31 MED ORDER — CEFAZOLIN SODIUM-DEXTROSE 2-3 GM-% IV SOLR
2.0000 g | Freq: Once | INTRAVENOUS | Status: AC
  Administered 2014-05-31: 2 g via INTRAVENOUS
  Filled 2014-05-31: qty 50

## 2014-05-31 MED ORDER — ONDANSETRON HCL 4 MG/2ML IJ SOLN
INTRAMUSCULAR | Status: AC
  Filled 2014-05-31: qty 2

## 2014-05-31 MED ORDER — LIDOCAINE HCL 1 % IJ SOLN
INTRAMUSCULAR | Status: DC | PRN
  Administered 2014-05-31: 10 mL

## 2014-05-31 MED ORDER — PROPOFOL 10 MG/ML IV BOLUS
INTRAVENOUS | Status: DC | PRN
  Administered 2014-05-31: 150 mg via INTRAVENOUS

## 2014-05-31 MED ORDER — FENTANYL CITRATE 0.05 MG/ML IJ SOLN
INTRAMUSCULAR | Status: AC
  Filled 2014-05-31: qty 2

## 2014-05-31 SURGICAL SUPPLY — 24 items
ABLATOR ENDOMETRIAL MYOSURE (ABLATOR) IMPLANT
CANISTER SUCT 3000ML (MISCELLANEOUS) ×3 IMPLANT
CATH ROBINSON RED A/P 16FR (CATHETERS) ×3 IMPLANT
CLOTH BEACON ORANGE TIMEOUT ST (SAFETY) ×3 IMPLANT
CONTAINER PREFILL 10% NBF 60ML (FORM) ×6 IMPLANT
DEVICE MYOSURE CLASSIC (MISCELLANEOUS) IMPLANT
DEVICE MYOSURE LITE (MISCELLANEOUS) IMPLANT
ELECT REM PT RETURN 9FT ADLT (ELECTROSURGICAL)
ELECTRODE REM PT RTRN 9FT ADLT (ELECTROSURGICAL) IMPLANT
FILTER ARTHROSCOPY CONVERTOR (FILTER) ×3 IMPLANT
GLOVE BIO SURGEON STRL SZ 6.5 (GLOVE) ×2 IMPLANT
GLOVE BIO SURGEONS STRL SZ 6.5 (GLOVE) ×1
GLOVE BIOGEL PI IND STRL 7.0 (GLOVE) ×1 IMPLANT
GLOVE BIOGEL PI INDICATOR 7.0 (GLOVE) ×2
GOWN STRL REUS W/TWL LRG LVL3 (GOWN DISPOSABLE) ×6 IMPLANT
NDL SPNL 20GX3.5 QUINCKE YW (NEEDLE) IMPLANT
NEEDLE SPNL 20GX3.5 QUINCKE YW (NEEDLE) IMPLANT
PACK VAGINAL MINOR WOMEN LF (CUSTOM PROCEDURE TRAY) ×3 IMPLANT
PAD OB MATERNITY 4.3X12.25 (PERSONAL CARE ITEMS) ×3 IMPLANT
SEAL ROD LENS SCOPE MYOSURE (ABLATOR) ×3 IMPLANT
SET TUBING HYSTEROSCOPY 2 NDL (TUBING) IMPLANT
TOWEL OR 17X24 6PK STRL BLUE (TOWEL DISPOSABLE) ×6 IMPLANT
TUBE HYSTEROSCOPY W Y-CONNECT (TUBING) IMPLANT
WATER STERILE IRR 1000ML POUR (IV SOLUTION) ×3 IMPLANT

## 2014-05-31 NOTE — Anesthesia Preprocedure Evaluation (Addendum)
Anesthesia Evaluation  Patient identified by MRN, date of birth, ID band Patient awake    Reviewed: Allergy & Precautions, H&P , Patient's Chart, lab work & pertinent test results, reviewed documented beta blocker date and time   Airway Mallampati: II  TM Distance: >3 FB Neck ROM: full    Dental no notable dental hx.    Pulmonary Current Smoker,  breath sounds clear to auscultation  Pulmonary exam normal       Cardiovascular Rhythm:regular Rate:Normal     Neuro/Psych    GI/Hepatic   Endo/Other    Renal/GU      Musculoskeletal   Abdominal   Peds  Hematology   Anesthesia Other Findings   Reproductive/Obstetrics                            Anesthesia Physical Anesthesia Plan  ASA: II  Anesthesia Plan:    Post-op Pain Management:    Induction: Intravenous  Airway Management Planned: LMA  Additional Equipment:   Intra-op Plan:   Post-operative Plan:   Informed Consent: I have reviewed the patients History and Physical, chart, labs and discussed the procedure including the risks, benefits and alternatives for the proposed anesthesia with the patient or authorized representative who has indicated his/her understanding and acceptance.   Dental Advisory Given and Dental advisory given  Plan Discussed with: CRNA and Surgeon  Anesthesia Plan Comments: (Resolved URI/Bronchitis Discussed GA with LMA, possible sore throat, potential need to switch to ETT, N/V, pulmonary aspiration. Questions answered. )       Anesthesia Quick Evaluation

## 2014-05-31 NOTE — Brief Op Note (Signed)
05/31/2014  1:31 PM  PATIENT:  Christine Romero  68 y.o. female  PRE-OPERATIVE DIAGNOSIS:  postmenopausal bleeding, endocervical polyp  POST-OPERATIVE DIAGNOSIS:  postmenopausal bleeding, endocervical polyp  PROCEDURE:  Procedure(s): DILATATION & CURETTAGE/ DIAGNOSTIC HYSTEROSCOPY (N/A)  SURGEON:  Surgeon(s) and Role:    * Damoni Causby E Amundson de Berton Lan, MD - Primary  PHYSICIAN ASSISTANT: NA  ASSISTANTS: none   ANESTHESIA:   paracervical block and  LMA  EBL:  Total I/O In: -  Out: 8 [Urine:3; Blood:5]  BLOOD ADMINISTERED:none  DRAINS: none   LOCAL MEDICATIONS USED:  LIDOCAINE  and Amount:  10 ml  SPECIMEN:  Source of Specimen:   endocervical curettings, endometrial curettings  DISPOSITION OF SPECIMEN:  PATHOLOGY  COUNTS:  YES  TOURNIQUET:  * No tourniquets in log *  DICTATION: .Other Dictation: Dictation Number    PLAN OF CARE: Discharge to home after PACU  PATIENT DISPOSITION:  PACU - hemodynamically stable.   Delay start of Pharmacological VTE agent (>24hrs) due to surgical blood loss or risk of bleeding: not applicable

## 2014-05-31 NOTE — Transfer of Care (Signed)
Immediate Anesthesia Transfer of Care Note  Patient: Christine Romero  Procedure(s) Performed: Procedure(s): DILATATION & CURETTAGE/ DIAGNOSTIC HYSTEROSCOPY (N/A)  Patient Location: PACU  Anesthesia Type:General  Level of Consciousness: awake, alert  and oriented  Airway & Oxygen Therapy: Patient Spontanous Breathing and Patient connected to nasal cannula oxygen  Post-op Assessment: Report given to PACU RN and Post -op Vital signs reviewed and stable  Post vital signs: Reviewed and stable  Complications: No apparent anesthesia complications

## 2014-05-31 NOTE — Anesthesia Postprocedure Evaluation (Signed)
  Anesthesia Post-op Note  Anesthesia Post Note  Patient: Christine Romero  Procedure(s) Performed: Procedure(s) (LRB): DILATATION & CURETTAGE/ DIAGNOSTIC HYSTEROSCOPY (N/A)  Anesthesia type: General  Patient location: PACU  Post pain: Pain level controlled  Post assessment: Post-op Vital signs reviewed  Last Vitals:  Filed Vitals:   05/31/14 1415  BP: 129/72  Pulse: 63  Temp:   Resp: 20    Post vital signs: Reviewed  Level of consciousness: sedated  Complications: No apparent anesthesia complications

## 2014-05-31 NOTE — Progress Notes (Signed)
Update to History and Physical  No marked change in status since office preop visit.   Patient examined.   OK to proceed.  

## 2014-05-31 NOTE — Discharge Instructions (Signed)
Hysteroscopy, Care After °Refer to this sheet in the next few weeks. These instructions provide you with information on caring for yourself after your procedure. Your health care provider may also give you more specific instructions. Your treatment has been planned according to current medical practices, but problems sometimes occur. Call your health care provider if you have any problems or questions after your procedure.  °WHAT TO EXPECT AFTER THE PROCEDURE °After your procedure, it is typical to have the following: °· You may have some cramping. This normally lasts for a couple days. °· You may have bleeding. This can vary from light spotting for a few days to menstrual-like bleeding for 3-7 days. °HOME CARE INSTRUCTIONS °· Rest for the first 1-2 days after the procedure. °· Only take over-the-counter or prescription medicines as directed by your health care provider. Do not take aspirin. It can increase the chances of bleeding. °· Take showers instead of baths for 2 weeks or as directed by your health care provider. °· Do not drive for 24 hours or as directed. °· Do not drink alcohol while taking pain medicine. °· Do not use tampons, douche, or have sexual intercourse for 2 weeks or until your health care provider says it is okay. °· Take your temperature twice a day for 4-5 days. Write it down each time. °· Follow your health care provider's advice about diet, exercise, and lifting. °· If you develop constipation, you may: °¨ Take a mild laxative if your health care provider approves. °¨ Add bran foods to your diet. °¨ Drink enough fluids to keep your urine clear or pale yellow. °· Try to have someone with you or available to you for the first 24-48 hours, especially if you were given a general anesthetic. °· Follow up with your health care provider as directed. °SEEK MEDICAL CARE IF: °· You feel dizzy or lightheaded. °· You feel sick to your stomach (nauseous). °· You have abnormal vaginal discharge. °· You  have a rash. °· You have pain that is not controlled with medicine. °SEEK IMMEDIATE MEDICAL CARE IF: °· You have bleeding that is heavier than a normal menstrual period. °· You have a fever. °· You have increasing cramps or pain, not controlled with medicine. °· You have new belly (abdominal) pain. °· You pass out. °· You have pain in the tops of your shoulders (shoulder strap areas). °· You have shortness of breath. °Document Released: 05/05/2013 Document Reviewed: 05/05/2013 °[employer]® Patient Information ©2015 [employer]. This information is not intended to replace advice given to you by your health care provider. Make sure you discuss any questions you have with your health care provider. ° °Post Anesthesia Home Care Instructions ° °Activity: °Get plenty of rest for the remainder of the day. A responsible adult should stay with you for 24 hours following the procedure.  °For the next 24 hours, DO NOT: °-Drive a car °-Operate machinery °-Drink alcoholic beverages °-Take any medication unless instructed by your physician °-Make any legal decisions or sign important papers. ° °Meals: °Start with liquid foods such as gelatin or soup. Progress to regular foods as tolerated. Avoid greasy, spicy, heavy foods. If nausea and/or vomiting occur, drink only clear liquids until the nausea and/or vomiting subsides. Call your physician if vomiting continues. ° °Special Instructions/Symptoms: °Your throat may feel dry or sore from the anesthesia or the breathing tube placed in your throat during surgery. If this causes discomfort, gargle with warm salt water. The discomfort should disappear within 24 hours. ° °  hours.     

## 2014-06-01 ENCOUNTER — Encounter (HOSPITAL_COMMUNITY): Payer: Self-pay | Admitting: Obstetrics and Gynecology

## 2014-06-01 NOTE — Op Note (Signed)
Christine Romero, Christine Romero NO.:  [id number]  MEDICAL RECORD NO.:  [id number]  LOCATION:  [facility]                          FACILITY:  [facility]  PHYSICIAN:  Lenard Galloway, M.D.   DATE OF BIRTH:  Nov 29, 1945  DATE OF PROCEDURE:  05/31/2014 DATE OF DISCHARGE:  05/31/2014                              OPERATIVE REPORT   PREOPERATIVE DIAGNOSIS:  Postmenopausal bleeding, endocervical polyp.  POSTOPERATIVE DIAGNOSIS:  Postmenopausal bleeding, polypoid cervical anatomy.  PROCEDURE:  Diagnostic hysteroscopy with fractional D and C.  SURGEON:  Lenard Galloway, M.D.  ANESTHESIA:  LMA, paracervical block with 1% lidocaine with epinephrine 1:100,000.  IV FLUIDS:  ---  EBL:  5 mL.  URINE OUTPUT:  3 mL.  SALINE FLUID DEFICIT:  25 mL.  COMPLICATIONS:  None.  INDICATIONS FOR THE PROCEDURE:  The patient is a 68 year old gravida 3, para 3-0-0-3, Caucasian female with her last menstrual period on July 30, 1983, who presented with postmenopausal bleeding for most of the month of September 2015.  The patient has been on daily oral estrogen and cyclic progesterone therapy for 13 days per month.  She has been on this regimen since she was in her 60s.  Endometrial biopsy on April 18, 2014, showed benign endometrium; and a pelvic ultrasound performed on April 21, 2014, showed an endometrial stripe of 4.37 mm and the uterus with no myometrial masses, for an endocervical mass measuring 8 x 6 mm.  The left ovary contained a collapsing calcified corpus luteum type cyst.  The patient had a normal CA-125 and a negative Pap smear.  A plan was made to proceed with a hysteroscopy with dilation and curettage based on the suspicion of cervical/lower uterine segment pathology.  A plan is made to proceed with a hysteroscopy with dilation and curettage after risks, benefits, and alternatives had been reviewed.  FINDINGS:  Exam under anesthesia revealed a small anteverted mobile uterus.  No  adnexal masses were appreciated.  The uterus was sounded to 6.5 cm.  Hysteroscopy demonstrated a very polypoid and significant folds to the endocervical canal.  The endometrial cavity itself looked normal.  There was no evidence of any polyps or fibroids appreciated.  The tubal ostial regions were unremarkable.  The cavity of the uterus appeared to be atrophic.  SPECIMENS:  Endocervical curettings were sent to Pathology separately from endometrial curettings.  PROCEDURE IN DETAIL:  The patient was reidentified in the preoperative hold area.  She did receive Ancef IV for antibiotic prophylaxis, and she received both TED hose and PAS stockings for DVT prophylaxis.  In the operating room, the patient received an LMA anesthetic and she was placed in the dorsal lithotomy position.  The lower abdomen, vagina, and perineum were prepped and she was then catheterized of urine.  She was sterilely draped.  An exam under anesthesia was performed.  The speculum was placed inside the vagina and a single-tooth tenaculum was placed on the anterior cervical lip.  A paracervical block was performed with a total of 10 mL of 1% lidocaine, plain.  The uterus was sounded to 6.5 cm.  The cervix was then dilated to a #21 Pratt dilator. The hysteroscope was then  inserted into the uterine cavity and continuous infusion of saline.  The findings are as noted above.  The hysteroscope was withdrawn.  The cervix was further dilated.  A Kevorkian curette was used to curette the endocervical canal.  This was sent to Pathology.  The endometrium was then curetted with a serrated curette, and this specimen was also sent to Pathology as a separate specimen.  The tenaculum was removed from the anterior cervical lip and gentle compression was applied with a ring forceps to create good hemostasis of the tenaculum site.  Hemostasis was excellent at the termination of the procedure.  This concluded the patient's  procedure.  There were no complications.  All needle, instrument, and sponge counts were correct. The patient was escorted to the recovery room in stable and awake condition after extubation of the LMA.     Lenard Galloway, M.D.     BES/MEDQ  D:  05/31/2014  T:  05/31/2014  Job:  [id number]

## 2014-06-02 ENCOUNTER — Encounter: Payer: Self-pay | Admitting: Internal Medicine

## 2014-06-03 ENCOUNTER — Ambulatory Visit: Payer: Medicare Other | Admitting: Obstetrics and Gynecology

## 2014-06-06 ENCOUNTER — Telehealth: Payer: Self-pay | Admitting: Emergency Medicine

## 2014-06-06 NOTE — Telephone Encounter (Signed)
Spoke with patient. Message from Dr. Quincy Simmonds given.  Patient verbalized understanding. Will follow up at office visit with Dr. Quincy Simmonds.  Routing to provider for final review. Patient agreeable to disposition. Will close encounter

## 2014-06-06 NOTE — Telephone Encounter (Signed)
Message left to return call to Scotland Korver at [phone].    

## 2014-06-06 NOTE — Telephone Encounter (Signed)
Pt is returning a call back to [place]

## 2014-06-06 NOTE — Telephone Encounter (Signed)
-----   Message from Kieler, MD sent at 06/03/2014  9:21 AM EST ----- Please inform patient of the benign polypoid proliferative endometrium.  Patient is probably not taking enough progesterone.  I will discuss her hormone therapy regimen with her at her postoperative visit.

## 2014-06-08 ENCOUNTER — Ambulatory Visit (AMBULATORY_SURGERY_CENTER): Payer: Medicare Other | Admitting: Internal Medicine

## 2014-06-08 ENCOUNTER — Encounter: Payer: Self-pay | Admitting: Internal Medicine

## 2014-06-08 VITALS — BP 123/73 | HR 67 | Temp 97.3°F | Resp 13 | Ht 64.0 in | Wt 127.0 lb

## 2014-06-08 DIAGNOSIS — D125 Benign neoplasm of sigmoid colon: Secondary | ICD-10-CM

## 2014-06-08 DIAGNOSIS — D122 Benign neoplasm of ascending colon: Secondary | ICD-10-CM

## 2014-06-08 DIAGNOSIS — Z86010 Personal history of colonic polyps: Secondary | ICD-10-CM

## 2014-06-08 DIAGNOSIS — K635 Polyp of colon: Secondary | ICD-10-CM

## 2014-06-08 MED ORDER — SODIUM CHLORIDE 0.9 % IV SOLN: 500.0000 mL | INTRAVENOUS | Status: DC

## 2014-06-08 NOTE — Patient Instructions (Signed)

## 2014-06-08 NOTE — Progress Notes (Signed)
No problems noted in the recovery room. maw 

## 2014-06-08 NOTE — Progress Notes (Signed)
A/ox3, pleased with MAC, report to RN 

## 2014-06-08 NOTE — Op Note (Signed)
Christine  [facility]. Christine Romero, [id number]   COLONOSCOPY PROCEDURE REPORT  PATIENT: Christine Romero, Math  MR#: [id number] BIRTHDATE: 1945/10/17 , 68  yrs. old GENDER: female ENDOSCOPIST: Lafayette Dragon, MD REFERRED IF:OYDX Laurann Montana, M.D. PROCEDURE DATE:  06/08/2014 PROCEDURE:   Colonoscopy with cold biopsy polypectomy and Colonoscopy with snare polypectomy First Screening Colonoscopy - Avg.  risk and is 50 yrs.  old or older - No.  Prior Negative Screening - Now for repeat screening. N/A  History of Adenoma - Now for follow-up colonoscopy & has been > or = to 3 yrs.  N/A History of Adenoma - Now for follow-up colonoscopy & has been > or = to 3 yrs.  Yes hx of adenoma.  Has been 3 or more years since last colonoscopy.  Polyps Removed Today? Yes. ASA CLASS:   Class II INDICATIONS:positive family history of colon cancer in patient's father.  Hyperplastic polyp removed in 2000.  Normal colonoscopy in 2005 and in November 2010. MEDICATIONS: Monitored anesthesia care and Propofol 300 mg IV  DESCRIPTION OF PROCEDURE:   After the risks benefits and alternatives of the procedure were thoroughly explained, informed consent was obtained.  The digital rectal exam revealed no abnormalities of the rectum.   The LB [id number]  endoscope was introduced through the anus and advanced to the cecum, which was identified by both the appendix and ileocecal valve. No adverse events experienced.   The quality of the prep was good, using MoviPrep  The instrument was then slowly withdrawn as the colon was fully examined.      COLON FINDINGS: Three smooth sessile polyps measuring 5 mm in size were found in the ascending colon and sigmoid colon.  A polypectomy was performed with a cold snare.  The resection was complete, the polyp tissue was completely retrieved and sent to histology.  A polypectomy was performed with cold forceps.  The resection was complete, the polyp tissue  was completely retrieved and sent to histology.   The colon was redundant.   There was mild diverticulosis noted in the sigmoid colon with associated muscular hypertrophy and tortuosity.   Small internal Grade I hemorrhoids were found.  Retroflexed views revealed no abnormalities. The time to cecum=8 minutes 16 seconds.  Withdrawal time=1337 minutes 0 seconds.  The scope was withdrawn and the procedure completed. COMPLICATIONS: There were no immediate complications.  ENDOSCOPIC IMPRESSION: 1.   Three sessile polyps were found in the ascending colonx2  and sigmoid colon x1 ; polypectomy was performed with a cold snare ascending colon x1 and sigmoid colon x1,; polypectomy was performed with cold forceps ascending colon x1, 2.   There was mild diverticulosis noted in the sigmoid colon 3..   Small internal Grade I hemorrhoids  RECOMMENDATIONS: 1.  Await pathology results 2.  High-fiber diet Recall colonoscopy pending path report  eSigned:  Lafayette Dragon, MD 06/08/2014 11:22 AM   cc:   PATIENT NAME:  Christine, Romero MR#: [id number]

## 2014-06-08 NOTE — Progress Notes (Signed)
Called to room to assist during endoscopic procedure.  Patient ID and intended procedure confirmed with present staff. Received instructions for my participation in the procedure from the performing physician.  

## 2014-06-09 ENCOUNTER — Telehealth: Payer: Self-pay

## 2014-06-09 NOTE — Telephone Encounter (Signed)
  Follow up Call-  Call back number 06/08/2014  Post procedure Call Back phone  # [phone]  Permission to leave phone message Yes     Patient questions:  Do you have a fever, pain , or abdominal swelling? No. Pain Score  0 *  Have you tolerated food without any problems? Yes.    Have you been able to return to your normal activities? Yes.    Do you have any questions about your discharge instructions: Diet   No. Medications  No. Follow up visit  No.  Do you have questions or concerns about your Care? No.  Actions: * If pain score is 4 or above: No action needed, pain <4.  No problems per the pt. maw

## 2014-06-13 ENCOUNTER — Encounter: Payer: Self-pay | Admitting: Internal Medicine

## 2014-06-15 ENCOUNTER — Encounter: Payer: Self-pay | Admitting: Obstetrics and Gynecology

## 2014-06-15 ENCOUNTER — Ambulatory Visit (INDEPENDENT_AMBULATORY_CARE_PROVIDER_SITE_OTHER): Payer: Medicare Other | Admitting: Obstetrics and Gynecology

## 2014-06-15 VITALS — BP 142/80 | HR 66 | Ht 64.5 in | Wt 127.4 lb

## 2014-06-15 DIAGNOSIS — Z9889 Other specified postprocedural states: Secondary | ICD-10-CM

## 2014-06-15 MED ORDER — ESTRADIOL 0.5 MG PO TABS: 0.5000 mg | ORAL_TABLET | Freq: Every day | ORAL | Status: DC

## 2014-06-15 MED ORDER — MEDROXYPROGESTERONE ACETATE 2.5 MG PO TABS: 2.5000 mg | ORAL_TABLET | Freq: Every day | ORAL | Status: DC

## 2014-06-15 NOTE — Progress Notes (Signed)
Patient ID: Christine Romero, female   DOB: Jun 02, 1946, 68 y.o.   MRN: [id number] GYNECOLOGY  VISIT   HPI: 68 y.o.   Married  Caucasian  female   [phone] with Patient's last menstrual period was 07/30/1983.   here for 2 week post op.   Status post hysteroscopy with dilation and curettage.  Had very little bleeding post op.   Stopped HRT in the past for one year.  Had less energy.   Having painful intercourse. Uses KY jelly and it does not really work.   GYNECOLOGIC HISTORY: Patient's last menstrual period was 07/30/1983. Contraception:  postmenopausal  Menopausal hormone therapy: Estrace 1mg , Provera 5 mg taking days number 1 - 13 per month.         OB History    Gravida Para Term Preterm AB TAB SAB Ectopic Multiple Living   3 3 3       3          Patient Active Problem List   Diagnosis Date Noted  . Postmenopausal bleeding 04/18/2014    Past Medical History  Diagnosis Date  . Dyspareunia   . Osteoporosis   . SVD (spontaneous vaginal delivery)     x 3  . Arthritis     hands    Past Surgical History  Procedure Laterality Date  . Breast surgery      breast augmentation  . Nasal septum surgery  1980's  . Wisdom tooth extraction    . Colonoscopy    . Polypectomy    . Gardners duct surgery  1972  . Dilatation & curettage/hysteroscopy with myosure N/A 05/31/2014    Procedure: DILATATION & CURETTAGE/ DIAGNOSTIC HYSTEROSCOPY;  Surgeon: Jamey Reas de Berton Lan, MD;  Location: [facility];  Service: Gynecology;  Laterality: N/A;    Current Outpatient Prescriptions  Medication Sig Dispense Refill  . calcium carbonate (OS-CAL) 600 MG TABS tablet Take 600 mg by mouth 2 (two) times daily with a meal.    . diphenhydrAMINE (BENADRYL) 25 mg capsule Take 25-50 mg by mouth at bedtime as needed for sleep.    Marland Kitchen estradiol (ESTRACE) 1 MG tablet Take 1 mg by mouth daily.    Marland Kitchen ibuprofen (ADVIL,MOTRIN) 800 MG tablet Take 1 tablet (800 mg total) by mouth every 8 (eight) hours as  needed. 30 tablet 0  . medroxyPROGESTERone (PROVERA) 5 MG tablet Take 5 mg by mouth as directed. Day 1-13 of each month    . Multiple Vitamins-Minerals (MULTIVITAMIN WITH MINERALS) tablet Take 1 tablet by mouth daily.    . valACYclovir (VALTREX) 1000 MG tablet Take 1 g by mouth as needed. Takes as needed for oral cold sores     No current facility-administered medications for this visit.     ALLERGIES: Talwin  Family History  Problem Relation Age of Onset  . Stroke Mother 19    dec  . Diabetes Mother   . Cancer Father 35    dec colon ca  . Colon cancer Father   . Other Brother 30    dec-MVA  . Multiple sclerosis Sister   . Multiple sclerosis Sister   . Colitis Sister   . Stroke Maternal Aunt   . Rectal cancer Neg Hx   . Stomach cancer Neg Hx     History   Social History  . Marital Status: Married    Spouse Name: N/A    Number of Children: N/A  . Years of Education: N/A   Occupational History  .  Not on file.   Social History Main Topics  . Smoking status: Current Some Day Smoker -- 0.25 packs/day    Types: Cigarettes  . Smokeless tobacco: Never Used  . Alcohol Use: 0.0 oz/week    0 Not specified per week     Comment: socially  . Drug Use: No  . Sexual Activity:    Partners: Male    Birth Control/ Protection: Post-menopausal     Comment: postmenopausal   Other Topics Concern  . Not on file   Social History Narrative    ROS:  Pertinent items are noted in HPI.  PHYSICAL EXAMINATION:    BP 142/80 mmHg  Pulse 66  Ht 5' 4.5" (1.638 m)  Wt 127 lb 6.4 oz (57.788 kg)  BMI 21.54 kg/m2  LMP 07/30/1983     General appearance: alert, cooperative and appears stated age   Pelvic: External genitalia:  no lesions              Urethra:  normal appearing urethra with no masses, tenderness or lesions              Bartholins and Skenes: normal                 Vagina: normal appearing vagina with normal color and discharge, no lesions              Cervix: normal  appearance                   Bimanual Exam:  Uterus:  uterus is normal size, shape, consistency and nontender                                      Adnexa: normal adnexa in size, nontender and no masses                                       ASSESSMENT    Status post hysteroscopy with dilation and curettage.  Proliferative endometrium.  Atrophic vaginal changes.   PLAN  Reduce Estradiol to 0.5 mg daily.  Provera 2.5 mg daily.  Recheck in 3 months.  Some bleeding is expected with the change in her regimen.  Will re-evaluate for possible EMB at that time.  Discussed cooking oils in the vagina for intercourse.  If this is not adequate, will prescribe vaginal estrogen cream or Vagifem.     An After Visit Summary was printed and given to the patient.  __15____ minutes face to face time of which over 50% was spent in counseling.

## 2014-06-15 NOTE — Patient Instructions (Signed)
Reduce the Estradiol to 0.5 mg daily and start taking the Provera 2.5 mg daily.  I will see you back in 3 months for a recheck appointment.  Some vaginal bleeding may be expected.  We will re-evaluate in 3 months to determine if another biopsy is needed.

## 2014-08-25 ENCOUNTER — Telehealth: Payer: Self-pay | Admitting: Gastroenterology

## 2014-08-25 NOTE — Telephone Encounter (Signed)
A user error has taken place.

## 2014-09-16 ENCOUNTER — Encounter: Payer: Self-pay | Admitting: Obstetrics and Gynecology

## 2014-09-16 ENCOUNTER — Ambulatory Visit (INDEPENDENT_AMBULATORY_CARE_PROVIDER_SITE_OTHER): Payer: Medicare Other | Admitting: Obstetrics and Gynecology

## 2014-09-16 VITALS — BP 120/70 | HR 72 | Resp 16 | Ht 64.5 in | Wt 127.0 lb

## 2014-09-16 DIAGNOSIS — Z8742 Personal history of other diseases of the female genital tract: Secondary | ICD-10-CM

## 2014-09-16 DIAGNOSIS — Z79890 Hormone replacement therapy (postmenopausal): Secondary | ICD-10-CM

## 2014-09-16 DIAGNOSIS — Z9289 Personal history of other medical treatment: Secondary | ICD-10-CM

## 2014-09-16 NOTE — Progress Notes (Signed)
GYNECOLOGY  VISIT   HPI: 69 y.o.   Married  Caucasian  female   [phone] with Patient's last menstrual period was 07/30/1983.   here for follow up of HRT regimen.  Patient asking to review events that lead up to surgery and findings and reasoning for changing HRT.   Patient had been on daily estrogen and cyclic progesterone for decades. Then developed persistent postmenopausal bleeding.    Is status post hysteroscopy with dilation and curettage 05/31/14. Pathology showed polypoid endocervical curetting and proliferative endometrium.   Post op HRT regimen was changed to: Reduced Estradiol to 0.5 mg daily.  Initiated Provera 2.5 mg daily.   Patient has had no further bleeding. No hot flashes.  Feels good on this and wishes to continue HRT.  GYNECOLOGIC HISTORY: Patient's last menstrual period was 07/30/1983. Contraception:   Post-Menopausal Menopausal hormone therapy: Provera 2.5 mg daily        OB History    Gravida Para Term Preterm AB TAB SAB Ectopic Multiple Living   3 3 3       3          Patient Active Problem List   Diagnosis Date Noted  . Postmenopausal bleeding 04/18/2014    Past Medical History  Diagnosis Date  . Dyspareunia   . Osteoporosis   . SVD (spontaneous vaginal delivery)     x 3  . Arthritis     hands    Past Surgical History  Procedure Laterality Date  . Breast surgery      breast augmentation  . Nasal septum surgery  1980's  . Wisdom tooth extraction    . Colonoscopy    . Polypectomy    . Gardners duct surgery  1972  . Dilatation & curettage/hysteroscopy with myosure N/A 05/31/2014    Procedure: DILATATION & CURETTAGE/ DIAGNOSTIC HYSTEROSCOPY;  Surgeon: Jamey Reas de Berton Lan, MD;  Location: [facility];  Service: Gynecology;  Laterality: N/A;    Current Outpatient Prescriptions  Medication Sig Dispense Refill  . calcium carbonate (OS-CAL) 600 MG TABS tablet Take 600 mg by mouth 2 (two) times daily with a meal.    .  diphenhydrAMINE (BENADRYL) 25 mg capsule Take 25-50 mg by mouth at bedtime as needed for sleep.    Marland Kitchen estradiol (ESTRACE) 0.5 MG tablet Take 1 tablet (0.5 mg total) by mouth daily. 90 tablet 1  . ibuprofen (ADVIL,MOTRIN) 800 MG tablet Take 1 tablet (800 mg total) by mouth every 8 (eight) hours as needed. 30 tablet 0  . medroxyPROGESTERone (PROVERA) 2.5 MG tablet Take 1 tablet (2.5 mg total) by mouth daily. 90 tablet 1  . Multiple Vitamins-Minerals (MULTIVITAMIN WITH MINERALS) tablet Take 1 tablet by mouth daily.    . valACYclovir (VALTREX) 1000 MG tablet Take 1 g by mouth as needed. Takes as needed for oral cold sores     No current facility-administered medications for this visit.     ALLERGIES: Talwin  Family History  Problem Relation Age of Onset  . Stroke Mother 45    dec  . Diabetes Mother   . Cancer Father 63    dec colon ca  . Colon cancer Father   . Other Brother 30    dec-MVA  . Multiple sclerosis Sister   . Multiple sclerosis Sister   . Colitis Sister   . Stroke Maternal Aunt   . Rectal cancer Neg Hx   . Stomach cancer Neg Hx     History   Social History  .  Marital Status: Married    Spouse Name: N/A  . Number of Children: N/A  . Years of Education: N/A   Occupational History  . Not on file.   Social History Main Topics  . Smoking status: Current Some Day Smoker -- 0.25 packs/day    Types: Cigarettes  . Smokeless tobacco: Never Used  . Alcohol Use: 0.0 oz/week    0 Standard drinks or equivalent per week     Comment: socially  . Drug Use: No  . Sexual Activity:    Partners: Male    Birth Control/ Protection: Post-menopausal     Comment: postmenopausal   Other Topics Concern  . Not on file   Social History Narrative    ROS:  Pertinent items are noted in HPI.  PHYSICAL EXAMINATION:    BP 120/70 mmHg  Pulse 72  Resp 16  Ht 5' 4.5" (1.638 m)  Wt 127 lb (57.607 kg)  BMI 21.47 kg/m2  LMP 07/30/1983     General appearance: alert, cooperative  and appears stated age  Pelvic: External genitalia:  no lesions              Urethra:  normal appearing urethra with no masses, tenderness or lesions              Bartholins and Skenes: normal                 Vagina: normal appearing vagina with normal color and discharge, no lesions              Cervix: normal appearance                   Bimanual Exam:  Uterus:  uterus is normal size, shape, consistency and nontender                                      Adnexa: normal adnexa in size, nontender and no masses                                       ASSESSMENT  Postmenopausal bleeding resolved.  Doing well on new HRT regimen.  History of benign proliferative endometrium.   PLAN  Perioperative surgical care reviewed in entirety. Questions answered. Continue with estradiol 0.5 mg and provera 2.5 mg daily.  Will give one month only.  Needs mammogram at [facility].  If mammogram normal, will refill HRT.  Annual exam due in September 2016.  Needs to have annual exams to continue of HRT through this office.   15 minutes face to face time, of which over 50% was spent in counseling.   After visit summary to patient.

## 2014-09-27 ENCOUNTER — Other Ambulatory Visit: Payer: Self-pay | Admitting: Obstetrics and Gynecology

## 2014-09-27 NOTE — Telephone Encounter (Signed)
Medication refill request: Estradiol 0.5 mg  Last AEX:  04/18/14 with BS Next AEX: No AEX scheduled for 2016  Last MMG (if hormonal medication request): 2014 wnl done at [facility] authorized: None  According to 09/16/14 office visit, Dr. Quincy Simmonds was going to send in one month of Estradiol 0.5 mg and Provera 2.5 mg because patient was due for mammogram. If mammogram was normal then she would refill HRT. Dr.Silva no rx was sent for both Estradiol and Provera okay to send in one month for both? Please advise

## 2014-09-28 MED ORDER — MEDROXYPROGESTERONE ACETATE 2.5 MG PO TABS: 2.5000 mg | ORAL_TABLET | Freq: Every day | ORAL | Status: DC

## 2014-09-28 NOTE — Telephone Encounter (Signed)
Please contact patient to see if her mammogram was done and if not when it is scheduled to be done, so we can watch for it.  I will refill one month only. Patient is aware of this.

## 2014-09-29 NOTE — Telephone Encounter (Signed)
Left Message To Call Back  

## 2014-09-29 NOTE — Telephone Encounter (Signed)
Of course I recommend that the patient have a mammogram whether she is on or off of the HRT!  She may ask to have a consultation with the radiologist on the day of a mammogram to express her concerns about having a mammogram with her implants.  I would recommend consultation with Dr. Isaiah Blakes.  I have experience with other patients having these same concerns.

## 2014-09-29 NOTE — Telephone Encounter (Signed)
Patient called me back, she said she has not scheduled. I offered to help patient schedule, she declined. She said she could schedule it herself. Patient is hesitant to schedule due to her not liking to have mammograms done, since she has implants in. I talked to her about the importance of having a mammogram done since she is on hrt. She said she is thinking about not continuing her hrt if she has to have a mammogram done in order to be on it. I told patient I would let Dr. Quincy Simmonds know, patient says she will give [place] a call if she decides to schedule/when it is done.  Please advise

## 2014-09-30 NOTE — Telephone Encounter (Signed)
Called and s/w patient in regards to Dr. Elza Rafter recommendations, she said she is aware of the importance but is still hesitant. Will think about having the consult with Dr. Darryll Capers and will let us know what she decides to do.

## 2014-10-11 ENCOUNTER — Telehealth: Payer: Self-pay | Admitting: *Deleted

## 2014-10-11 NOTE — Telephone Encounter (Signed)
Thank you for the update!

## 2014-10-11 NOTE — Telephone Encounter (Signed)
Patient called me back asking if I could schedule consult with Dr. Isaiah Blakes. I told patient I wouldn't mind helping her with that. Asked her if I could also schedule her mammogram for the same day, patient agreed. I called [facility] and s/w Tina with Dr. Guerry Minors office. She said she was going to talk to Dr. Darryll Capers sometime tomorrow because she has left the office for the day, in regards to having Dr. Darryll Capers call the patient and talk with her that way as opposed to having patient come in for a consult and be charged. I told them that would be fine and if they could just contact me sometime tomorrow with what she says. Patient is notified and aware that as soon as I hear something from Dr. Darrall Dears office I will give her a call back with what she says.  (routed to Dr. Quincy Simmonds for [facility], will wait for callback from Dr. Rolanda Jay office).

## 2014-10-12 NOTE — Telephone Encounter (Addendum)
Christine Romero from Dr. Guerry Minors office called me back and they said that they are recommending the patient have a OV with Dr. Isaiah Blakes to talk, and try to reserve a mammogram appointment the same day that way Dr. Isaiah Blakes will be there to help discuss the results with the patient. She said that Dr. Isaiah Blakes is recommending the 3D mammogram for the patient.  Was given Appointment for patient to choose March 24th at 8:45, 9:00, 10:00, 10:30 March 25th 8:30, 9:30, 10:30 March 28th 1:00, 1:30, 2:00 March 29th 8:30, 10:30  And for me to give Janett Billow a direct call back at [phone] and I could lm   Tried to call patient Left Message To Call Back

## 2014-10-17 NOTE — Telephone Encounter (Signed)
Called and s/w patient in regards to mammogram appointment she said she could do 10/20/14 @ 10:30 and if that wasn't available she could do 10/21/14 @ 10:30. I told patient I would call her back when appointment had been made.   Called Janett Billow and got her Voicemail left message in regards to appointment and to call me back if those appointments could work for Dr. Isaiah Blakes.

## 2014-10-17 NOTE — Telephone Encounter (Signed)
Christine Romero with [facility] calling with patient appointment time. 3/25 at 10:30.

## 2014-10-17 NOTE — Telephone Encounter (Signed)
Left Message To Call Back  

## 2014-10-17 NOTE — Telephone Encounter (Signed)
Called patient she is aware of appointment time made.  Routed to provider, okay to close encounter Dr. Quincy Simmonds?

## 2014-10-26 ENCOUNTER — Other Ambulatory Visit: Payer: Self-pay | Admitting: Obstetrics and Gynecology

## 2014-10-26 MED ORDER — MEDROXYPROGESTERONE ACETATE 2.5 MG PO TABS: 2.5000 mg | ORAL_TABLET | Freq: Every day | ORAL | Status: DC

## 2014-10-26 MED ORDER — ESTRADIOL 0.5 MG PO TABS: 0.5000 mg | ORAL_TABLET | Freq: Every day | ORAL | Status: DC

## 2014-10-27 ENCOUNTER — Telehealth: Payer: Self-pay

## 2014-10-27 NOTE — Telephone Encounter (Signed)
Spoke with patient and advised received results of mammogram from [facility] and it was normal.  Advised Dr. Quincy Simmonds Escribed Estradiol and Provera and okay to continue with HRT.  Patient thanked me for calling.

## 2015-01-23 ENCOUNTER — Other Ambulatory Visit: Payer: Self-pay

## 2015-05-01 ENCOUNTER — Other Ambulatory Visit: Payer: Self-pay | Admitting: Internal Medicine

## 2015-05-01 DIAGNOSIS — Z129 Encounter for screening for malignant neoplasm, site unspecified: Secondary | ICD-10-CM

## 2015-05-09 ENCOUNTER — Ambulatory Visit
Admission: RE | Admit: 2015-05-09 | Discharge: 2015-05-09 | Disposition: A | Discharge status: Home / Self Care | Payer: Medicare Other | Source: Ambulatory Visit | Attending: Internal Medicine | Admitting: Internal Medicine

## 2015-05-09 DIAGNOSIS — Z129 Encounter for screening for malignant neoplasm, site unspecified: Secondary | ICD-10-CM

## 2015-06-20 ENCOUNTER — Ambulatory Visit (INDEPENDENT_AMBULATORY_CARE_PROVIDER_SITE_OTHER): Payer: Medicare Other | Admitting: Obstetrics and Gynecology

## 2015-06-20 ENCOUNTER — Encounter: Payer: Self-pay | Admitting: Obstetrics and Gynecology

## 2015-06-20 VITALS — BP 132/82 | HR 72 | Resp 16 | Ht 64.5 in | Wt 124.0 lb

## 2015-06-20 DIAGNOSIS — N63 Unspecified lump in breast: Secondary | ICD-10-CM

## 2015-06-20 DIAGNOSIS — Z79890 Hormone replacement therapy (postmenopausal): Secondary | ICD-10-CM

## 2015-06-20 DIAGNOSIS — Z124 Encounter for screening for malignant neoplasm of cervix: Secondary | ICD-10-CM

## 2015-06-20 DIAGNOSIS — Z01419 Encounter for gynecological examination (general) (routine) without abnormal findings: Secondary | ICD-10-CM | POA: Diagnosis not present

## 2015-06-20 DIAGNOSIS — N6310 Unspecified lump in the right breast, unspecified quadrant: Secondary | ICD-10-CM

## 2015-06-20 MED ORDER — ESTRADIOL 0.5 MG PO TABS: ORAL_TABLET | ORAL | Status: DC

## 2015-06-20 MED ORDER — MEDROXYPROGESTERONE ACETATE 2.5 MG PO TABS: 2.5000 mg | ORAL_TABLET | Freq: Every day | ORAL | Status: DC

## 2015-06-20 MED ORDER — ESTRADIOL 10 MCG VA TABS: ORAL_TABLET | VAGINAL | Status: DC

## 2015-06-20 NOTE — Patient Instructions (Signed)

## 2015-06-20 NOTE — Progress Notes (Signed)
3D Right Diagnostic Mammogram with ultrasound if needed scheduled for 06-28-15 at 1015 at [facility]. Patient agreeable to date and time.

## 2015-06-20 NOTE — Progress Notes (Signed)
69 y.o. G34P3003 [name] here for annual exam.    No spotting since dilation and curettage. On HRT. Asked to review hysteroscopy and dilation and curettage procedure and findings again.   Vaginal dryness.  Having incontinence of urine with cough.   PCP:   Lavone Orn, MD  Patient's last menstrual period was 07/30/1983.          Sexually active: Yes.    The current method of family planning is postmenopausal.   Exercising: Yes.    physical work outside Smoker:  Former smoker-quit 2014  Health Maintenance: Pap:  04/18/14 WNL History of abnormal Pap:  no MMG:  10/21/14 3D-BiRads 1-negative Colonoscopy:  2015-repeat in 5 years BMD:   ? Feels like was fairly recent, ? where  TDaP:  UTD with PCP Screening Labs:   None.    reports that she has quit smoking. Her smoking use included Cigarettes. She smoked 0.25 packs per day. She has never used smokeless tobacco. She reports that she drinks alcohol. She reports that she does not use illicit drugs.  Past Medical History  Diagnosis Date  . Dyspareunia   . Osteoporosis   . SVD (spontaneous vaginal delivery)     x 3  . Arthritis     hands    Past Surgical History  Procedure Laterality Date  . Breast surgery      breast augmentation  . Nasal septum surgery  1980's  . Wisdom tooth extraction    . Colonoscopy    . Polypectomy    . Gardners duct surgery  1972  . Dilatation & curettage/hysteroscopy with myosure N/A 05/31/2014    Procedure: DILATATION & CURETTAGE/ DIAGNOSTIC HYSTEROSCOPY;  Surgeon: Jamey Reas de Berton Lan, MD;  Location: [facility];  Service: Gynecology;  Laterality: N/A;    Current Outpatient Prescriptions  Medication Sig Dispense Refill  . calcium carbonate (OS-CAL) 600 MG TABS tablet Take 600 mg by mouth 2 (two) times daily with a meal.    . diphenhydrAMINE (BENADRYL) 25 mg capsule Take 25-50 mg by mouth at bedtime as needed for sleep.    Marland Kitchen estradiol (ESTRACE) 0.5 MG tablet Take 1 tablet  (0.5 mg total) by mouth daily. 30 tablet 5  . ibuprofen (ADVIL,MOTRIN) 800 MG tablet Take 1 tablet (800 mg total) by mouth every 8 (eight) hours as needed. 30 tablet 0  . medroxyPROGESTERone (PROVERA) 2.5 MG tablet Take 1 tablet (2.5 mg total) by mouth daily. 30 tablet 5  . Multiple Vitamins-Minerals (MULTIVITAMIN WITH MINERALS) tablet Take 1 tablet by mouth daily.    . Probiotic Product (ALIGN PO) Take by mouth.    . valACYclovir (VALTREX) 1000 MG tablet Take 1 g by mouth as needed. Takes as needed for oral cold sores     No current facility-administered medications for this visit.    Family History  Problem Relation Age of Onset  . Stroke Mother 11    dec  . Diabetes Mother   . Cancer Father 53    dec colon ca  . Colon cancer Father   . Other Brother 30    dec-MVA  . Multiple sclerosis Sister   . Multiple sclerosis Sister   . Colitis Sister   . Stroke Maternal Aunt   . Rectal cancer Neg Hx   . Stomach cancer Neg Hx     ROS:  Pertinent items are noted in HPI.  Otherwise, a comprehensive ROS was negative.  Exam:   BP 132/82 mmHg  Pulse  72  Resp 16  Ht 5' 4.5" (1.638 m)  Wt 124 lb (56.246 kg)  BMI 20.96 kg/m2  LMP 07/30/1983    General appearance: alert, cooperative and appears stated age Head: Normocephalic, without obvious abnormality, atraumatic Neck: no adenopathy, supple, symmetrical, trachea midline and thyroid normal to inspection and palpation Lungs: clear to auscultation bilaterally Breasts: No nipple retraction or dimpling, No nipple discharge or bleeding, No axillary or supraclavicular adenopathy, bilateral implants.  4 mm firm mass of right breast at 10:00.  Left breast no masses. Heart: regular rate and rhythm Abdomen: soft, non-tender; bowel sounds normal; no masses,  no organomegaly Extremities: extremities normal, atraumatic, no cyanosis or edema Skin: Skin color, texture, turgor normal. No rashes or lesions Lymph nodes: Cervical, supraclavicular, and  axillary nodes normal. No abnormal inguinal nodes palpated Neurologic: Grossly normal  Pelvic: External genitalia:  no lesions              Urethra:  normal appearing urethra with no masses, tenderness or lesions              Bartholins and Skenes: normal                 Vagina: normal appearing vagina with normal color and discharge, no lesions              Cervix: no lesions              Pap taken: No. Bimanual Exam:  Uterus:  normal size, contour, position, consistency, mobility, non-tender              Adnexa: normal adnexa and no mass, fullness, tenderness              Rectovaginal: Yes.  .  Confirms.              Anus:  normal sphincter tone, no lesions  Chaperone was present for exam.  Assessment:   Well woman visit with normal exam. HRT patient. Hx breast implants. Right breast lump.  Status post hysteroscopy with dilation and curettage for postmenopausal bleeding 2015.  Genuine stress incontinence.  Plan: Yearly mammogram recommended after age 41. Dx right mammogram and ultrasound.  Recommended self breast exam.  Pap and HR HPV as above. Discussed Calcium, Vitamin D, regular exercise program including cardiovascular and weight bearing exercise. Labs performed.  No..   See orders. Refills given on medications.  Yes.  .  See orders.  Refill on HRT- Estrace 0.5 mg, will take 1/2 tablet daily.  Provera 2.5 mg daily.   I discussed risks of DVT, PE, MI, stroke, and breast cancer.  Patient wishes to continue and will wean off.   Vagifem Rx.   All hormone treatment may need to cease depending on her diagnostic mammogram and ultrasound of the breast. Prior surgical care and findings and rationale for changing to a daily HRT regimen discussed. Patient will check on her bone density study and let us know the status.  We do not have a recent study report. Declines PT or surgical care for stress incontinence.   Follow up annually and prn.   After visit summary provided.

## 2015-06-21 ENCOUNTER — Telehealth: Payer: Self-pay

## 2015-06-21 ENCOUNTER — Ambulatory Visit: Payer: Medicare Other | Admitting: Obstetrics and Gynecology

## 2015-06-21 ENCOUNTER — Telehealth: Payer: Self-pay | Admitting: Obstetrics and Gynecology

## 2015-06-21 NOTE — Telephone Encounter (Signed)
Patient is asking for the results from her urine done 06/20/15.

## 2015-06-21 NOTE — Telephone Encounter (Signed)
Called and LM for patient to call back about Estradiol 0.5 mg Rx.  She has [facility] listed as her pharmacy, however, a there is a prior authorization request for [facility].  I need her to confirm which pharmacy she wants to use.

## 2015-06-21 NOTE — Telephone Encounter (Signed)
Patient returned your call. Patient would like her prescription to be call to [facility].

## 2015-06-21 NOTE — Telephone Encounter (Signed)
Left message to call Maleigh Bagot at [phone]. 

## 2015-06-21 NOTE — Telephone Encounter (Signed)
Spoke to patient asking her to call [facility] and have them to transfer the Rx for Estradiol to [facility].  She stated that she will just get the Rx this time from [place] and use [facility] next time.  She wants me to change her primary pharmacy to [facility].  She wanted to discuss her appt she had on 06/20/2015 and her urine sample as well as her bone density test with Claiborne Billings.  I informed her that Claiborne Billings was out and would be back next week.  Routed to [employer]

## 2015-06-26 NOTE — Telephone Encounter (Signed)
Spoke with patient. Patient is asking if her urine was sent off for testing. Patient denies having any urinary symptoms at the time of her appointment or now. Advised urine was not sent off for culture. Advised we send urine off for culture if the patient has any concerns or symptoms. Patient is agreeable.  Routing to provider for final review. Patient agreeable to disposition. Will close encounter.

## 2015-06-27 ENCOUNTER — Telehealth: Payer: Self-pay | Admitting: Obstetrics and Gynecology

## 2015-06-27 NOTE — Telephone Encounter (Signed)
Lmtcb//kn 

## 2015-06-27 NOTE — Telephone Encounter (Signed)
Christine Romero, I left this patient a message. I am not sure what she is needing, but will you try her one more time.//kn

## 2015-06-27 NOTE — Telephone Encounter (Signed)
Robley Fries, CMA at 06/27/2015 3:32 PM     Status: Signed       Expand All Collapse All   [email or web address]            Warden Fillers, CMA at 06/21/2015 12:56 PM     Status: Signed       Expand All Collapse All   Spoke to patient asking her to call [facility] and have them to transfer the Rx for Estradiol to [facility]. She stated that she will just get the Rx this time from [place] and use [facility] next time. She wants me to change her primary pharmacy to [facility].  She wanted to discuss her appt she had on 06/20/2015 and her urine sample as well as her bone density test with Claiborne Billings. I informed her that Claiborne Billings was out and would be back next week.  Routed to [employer]

## 2015-06-27 NOTE — Telephone Encounter (Signed)
Routing to [employer] CMA.

## 2015-06-27 NOTE — Telephone Encounter (Signed)
Patient says she is returning call to [facility].

## 2015-06-29 NOTE — Telephone Encounter (Signed)
The best way to update the chart is to have a copy of the bone density.  She may authorize [place] to get a copy or she can provide it to [place] at her next office visit.  The date of the study is important, but the actual reading of the numbers is more important.  When we do bone densities, it is important to be able to compare the numbers over time.

## 2015-06-29 NOTE — Telephone Encounter (Signed)
Spoke with patient. Patient states that she wanted to let Claiborne Billings know she had her Bone Density done this year at [facility]. Is not able to provide the date of this study as she is not at home where her notes are. Would like this to be updated in her chart. Patient will provide exact date at next appointment.  Routing to Dr.Silva as FYI so this can be update in her chart.

## 2015-06-30 NOTE — Telephone Encounter (Signed)
Left message to call Kaitlyn at [phone]. 

## 2015-07-04 NOTE — Telephone Encounter (Signed)
Left message to call Turrell Severt at [phone]. 

## 2015-07-04 NOTE — Telephone Encounter (Signed)
Patient returning call.

## 2015-07-04 NOTE — Telephone Encounter (Signed)
Spoke with patient. Advised of message as seen below from [place]. Patient would like to bring her BMD results to her next office visit with Dr.Silva. Advised of importance of having the actual reading for her chart. Patient is agreeable.  Routing to provider for final review. Patient agreeable to disposition. Will close encounter.

## 2015-07-05 ENCOUNTER — Telehealth: Payer: Self-pay | Admitting: Emergency Medicine

## 2015-07-05 NOTE — Telephone Encounter (Signed)
Call to patient to schedule follow up breast check with Dr. Quincy Simmonds for [facility] January per Dr. Quincy Simmonds.  Imaging from [facility] from 06/28/15 to be scanned.  Patient agreeable to schedule appointment for breast check, appointment scheduled for 08/16/15 at 1030.  Routing to provider for final review. Patient agreeable to disposition. Will close encounter.

## 2015-07-14 ENCOUNTER — Telehealth: Payer: Self-pay | Admitting: Obstetrics and Gynecology

## 2015-07-14 NOTE — Telephone Encounter (Signed)
Spoke with patient. Advised of message as seen below from [facility]. Patient verbalizes understanding.  Routing to provider for final review. Patient agreeable to disposition. Will close encounter.

## 2015-07-14 NOTE — Telephone Encounter (Signed)
Shantal with [employer] called to confirm that estradiol 0.5 mg has been approved for patient until 07/12/2016.

## 2015-08-16 ENCOUNTER — Ambulatory Visit (INDEPENDENT_AMBULATORY_CARE_PROVIDER_SITE_OTHER): Payer: Medicare Other | Admitting: Obstetrics and Gynecology

## 2015-08-16 ENCOUNTER — Encounter: Payer: Self-pay | Admitting: Obstetrics and Gynecology

## 2015-08-16 VITALS — BP 122/74 | HR 74 | Resp 18 | Ht 64.5 in | Wt 127.0 lb

## 2015-08-16 DIAGNOSIS — Z79890 Hormone replacement therapy (postmenopausal): Secondary | ICD-10-CM

## 2015-08-16 DIAGNOSIS — N6001 Solitary cyst of right breast: Secondary | ICD-10-CM

## 2015-08-16 NOTE — Progress Notes (Signed)
GYNECOLOGY  VISIT   HPI: 70 y.o.   Married  Caucasian female   [phone] with Patient's last menstrual period was 07/30/1983.   here for   Breast check  06/20/15 - annual exam - breast exam: No nipple retraction or dimpling, No nipple discharge or bleeding, No axillary or supraclavicular adenopathy, bilateral implants. 4 mm firm mass of right breast at 10:00. Left breast no masses. Had dx mammogram and ultrasound at [facility]. 4 mm complicated cyst noted on right breast ultrasound.  Due for repeat imaging in May 2017.   Patient is on Estrace 0.5 mg 1/2 tablet daily and Provera 2.5 mg daily.  Never had any hot flashes.  Has Vagifem Rx also.   GYNECOLOGIC HISTORY: Patient's last menstrual period was 07/30/1983. Contraception:Post menopausal Menopausal hormone therapy: Medroxyprogesterone, Estrodial Last mammogram: 06/28/2015 Incomplete Last pap smear: 04/18/2014 WNL        OB History    Gravida Para Term Preterm AB TAB SAB Ectopic Multiple Living   3 3 3       3          Patient Active Problem List   Diagnosis Date Noted  . Postmenopausal bleeding 04/18/2014    Past Medical History  Diagnosis Date  . Dyspareunia   . Osteoporosis   . SVD (spontaneous vaginal delivery)     x 3  . Arthritis     hands  . Breast lump     Past Surgical History  Procedure Laterality Date  . Breast surgery      breast augmentation  . Nasal septum surgery  1980's  . Wisdom tooth extraction    . Colonoscopy    . Polypectomy    . Gardners duct surgery  1972  . Dilatation & curettage/hysteroscopy with myosure N/A 05/31/2014    Procedure: DILATATION & CURETTAGE/ DIAGNOSTIC HYSTEROSCOPY;  Surgeon: Jamey Reas de Berton Lan, MD;  Location: [facility];  Service: Gynecology;  Laterality: N/A;    Current Outpatient Prescriptions  Medication Sig Dispense Refill  . calcium carbonate (OS-CAL) 600 MG TABS tablet Take 600 mg by mouth 2 (two) times daily with a meal.    . diphenhydrAMINE (BENADRYL)  25 mg capsule Take 50 mg by mouth at bedtime as needed for sleep.     Marland Kitchen estradiol (ESTRACE) 0.5 MG tablet Take 1/2 tablet (0.25 mg) by mouth daily. 90 tablet 3  . Estradiol 10 MCG TABS vaginal tablet Place one tablet (10 mcg)  in the vagina at bedtime for 2 weeks and then one in the vaginl at bedtime twice a week. 34 tablet 24  . ibuprofen (ADVIL,MOTRIN) 800 MG tablet Take 1 tablet (800 mg total) by mouth every 8 (eight) hours as needed. 30 tablet 0  . medroxyPROGESTERone (PROVERA) 2.5 MG tablet Take 1 tablet (2.5 mg total) by mouth daily. 90 tablet 3  . Multiple Vitamins-Minerals (MULTIVITAMIN WITH MINERALS) tablet Take 1 tablet by mouth daily.    . Probiotic Product (ALIGN PO) Take by mouth.    . valACYclovir (VALTREX) 1000 MG tablet Take 1 g by mouth as needed. Takes as needed for oral cold sores     No current facility-administered medications for this visit.     ALLERGIES: [place]  Family History  Problem Relation Age of Onset  . Stroke Mother 37    dec  . Diabetes Mother   . Cancer Father 25    dec colon ca  . Colon cancer Father   . Other Brother 106  dec-MVA  . Multiple sclerosis Sister   . Multiple sclerosis Sister   . Colitis Sister   . Stroke Maternal Aunt   . Rectal cancer Neg Hx   . Stomach cancer Neg Hx     Social History   Social History  . Marital Status: Married    Spouse Name: N/A  . Number of Children: N/A  . Years of Education: N/A   Occupational History  . Not on file.   Social History Main Topics  . Smoking status: Former Smoker -- 0.25 packs/day    Types: Cigarettes  . Smokeless tobacco: Never Used     Comment: quit 2014  . Alcohol Use: 0.0 oz/week    0 Standard drinks or equivalent per week     Comment: socially  . Drug Use: No  . Sexual Activity:    Partners: Male    Birth Control/ Protection: Post-menopausal     Comment: postmenopausal   Other Topics Concern  . Not on file   Social History Narrative    ROS:  Pertinent items are  noted in HPI.  PHYSICAL EXAMINATION:    LMP 07/30/1983    General appearance: alert, cooperative and appears stated age   Breasts: normal appearance, no masses or tenderness, consistent with bilateral implants.  Right breast with 4 mm firm mass at 8:00.  This is not at 10:00.  No retractions, nipple discharge or palpable nodes of either breast.  No masses of left breast.   The mass of the right breast is very superficial and easy to feel.    Chaperone was present for exam.  ASSESSMENT  Right breast mass - complicated breast cyst.  Hx implants.  HRT.   PLAN    Discussed breast mass care.  Continue SBE and call if enlarges.  Has appt for dx mammogram and ultrasound in May 2017 at [facility].  I do not think that the patient needs a biopsy or removal at this time.  We discussed the overall plan to wean off HRT.  Patient may try to cut each of her pills in half.  She is taking Estrace 0.25 mg daily and can go down further.  She will cut the Provera in half.  She understands the need for taking both the estrogen and the progesterone.  Follow up for annual exam and prn.   An After Visit Summary was printed and given to the patient.  __15____ minutes face to face time of which over 50% was spent in counseling.

## 2015-08-25 ENCOUNTER — Telehealth: Payer: Self-pay | Admitting: Emergency Medicine

## 2015-08-25 NOTE — Telephone Encounter (Signed)
-----   Message from Nunzio Cobbs, MD sent at 08/24/2015  7:59 PM EST ----- Regarding: RE: Mammogram hold  Ok to remove from mammogram hold and place in recall for May 2017.  [place] ----- Message -----    From: Michele Mcalpine, RN    Sent: 08/24/2015   4:56 PM      To: Nunzio Cobbs, MD Subject: Mammogram hold                                 Dr. Quincy Simmonds,  Patient was to remain in mammogram hold until office visit for breast check on 08/16/15. Okay to remove from hold?

## 2015-08-25 NOTE — Telephone Encounter (Signed)
Out of hold.  Recall in place.  ID: [id number] Status: New [10]  Patient: AALIYHA, GUERRIERO Visit Type: OFFICE VISIT [id number]  Notification Date: 11/23/2015 Recall Date: 12/23/2015  Expiration Date  Letter: RECALL LETTER [10530]  Audit Trail: User: Time: Status:   Michele Mcalpine, RN [id number] 08/25/2015 8:15 AM New [10]   Scheduling Instructions  04 Recall-[facility] -Right Breast Mammogram

## 2016-01-25 ENCOUNTER — Telehealth: Payer: Self-pay | Admitting: *Deleted

## 2016-01-25 NOTE — Telephone Encounter (Signed)
Please contact patient in regards to 04 recall. She is due for a right breast MMG  Thanks Margaretha Sheffield

## 2016-01-29 NOTE — Telephone Encounter (Signed)
Spoke with patient. She was currently not home to look at schedule. She said she would call [facility] as soon as she got home to schedule f/u u/s of R breast. I advised her that I would call back in a few days to see if she has scheduled or needs help scheduling it. -sco

## 2016-02-07 NOTE — Telephone Encounter (Signed)
Left message for pt to return call. -sco

## 2016-02-07 NOTE — Telephone Encounter (Signed)
Recall extended -eh 

## 2016-02-07 NOTE — Telephone Encounter (Signed)
Pt called stating she made appt on 7/18.17 at [facility] for R Breast MMG. -sco

## 2016-02-29 ENCOUNTER — Encounter: Payer: Self-pay | Admitting: Obstetrics and Gynecology

## 2016-04-29 ENCOUNTER — Other Ambulatory Visit: Payer: Self-pay | Admitting: Internal Medicine

## 2016-04-29 DIAGNOSIS — F17211 Nicotine dependence, cigarettes, in remission: Secondary | ICD-10-CM

## 2016-05-03 ENCOUNTER — Other Ambulatory Visit: Payer: Medicare Other

## 2016-05-13 ENCOUNTER — Ambulatory Visit
Admission: RE | Admit: 2016-05-13 | Discharge: 2016-05-13 | Disposition: A | Discharge status: Home / Self Care | Payer: Medicare Other | Source: Ambulatory Visit | Attending: Internal Medicine | Admitting: Internal Medicine

## 2016-05-13 DIAGNOSIS — F17211 Nicotine dependence, cigarettes, in remission: Secondary | ICD-10-CM

## 2016-07-10 NOTE — Progress Notes (Signed)
70 y.o. G39P3003 [name] here for annual exam.    Doing well.   On HRT.  No further bleeding or spotting.   Not using vaginal estrogen.  Was too expensive.  PCP:   Lavone Orn, MD  Patient's last menstrual period was 07/30/1983.           Sexually active: Yes.   female The current method of family planning is post menopausal status.    Exercising: Yes.    Yard work.   Smoker:  Former--quit 2014  Health Maintenance: Pap:  04-18-14 Negative History of abnormal Pap:  no MMG:  10-21-14 3D/Density A/Stable Bil.Implants/Neg/BiRads1:[facility]. 06-28-15 3D Rt.Br.Diag/Density B/Incomplete, rec. Rt.Br.U/S; 02-13-16 Rt.Br.U/S--no sonographic evidence of malignancy; there is a 0.7cm oval simple cyst in Rt.breast, benign. Routine annual screening recommended/BiRads2:[facility]--called [facility] and patient has screening scheduled 09/2016. Colonoscopy:  2015 polyps with Dr.Brodie;next due 2020 BMD:  ?Unsure Result: normal with Dr.Griffin TDaP:  PCP Gardasil:   N/A Hep C:  Negative this year.  Screening Labs:  Hb today: PCP, Urine today:  PCP.    reports that she has quit smoking. Her smoking use included Cigarettes. She smoked 0.25 packs per day. She has never used smokeless tobacco. She reports that she does not drink alcohol or use drugs.  Past Medical History:  Diagnosis Date  . Arthritis    hands  . Breast lump   . Dyspareunia   . Osteoporosis   . SVD (spontaneous vaginal delivery)    x 3    Past Surgical History:  Procedure Laterality Date  . BREAST SURGERY     breast augmentation  . COLONOSCOPY    . DILATATION & CURETTAGE/HYSTEROSCOPY WITH MYOSURE N/A 05/31/2014   Procedure: DILATATION & CURETTAGE/ DIAGNOSTIC HYSTEROSCOPY;  Surgeon: Jamey Reas de Berton Lan, MD;  Location: [facility];  Service: Gynecology;  Laterality: N/A;  . gardners duct surgery  1972  . NASAL SEPTUM SURGERY  1980's  . POLYPECTOMY    . WISDOM TOOTH EXTRACTION      Current Outpatient  Prescriptions  Medication Sig Dispense Refill  . Apoaequorin (PREVAGEN PO) Take 1 tablet by mouth daily.    . Ascorbic Acid (VITAMIN C) 1000 MG tablet Take 1,000 mg by mouth daily.    Marland Kitchen aspirin EC 81 MG tablet Take 81 mg by mouth daily.    . calcium carbonate (OS-CAL) 600 MG TABS tablet Take 600 mg by mouth 2 (two) times daily with a meal.    . Cholecalciferol (VITAMIN D3) 1000 units CAPS Take 1 capsule by mouth daily.    . diphenhydrAMINE (BENADRYL) 25 mg capsule Take 50 mg by mouth at bedtime as needed for sleep.     Marland Kitchen ibuprofen (ADVIL,MOTRIN) 800 MG tablet Take 1 tablet (800 mg total) by mouth every 8 (eight) hours as needed. 30 tablet 0  . KRILL OIL PO Take 1 tablet by mouth daily.    . Multiple Vitamin (MULTIVITAMIN) capsule Take 1 capsule by mouth daily.    . Multiple Vitamins-Minerals (MULTIVITAMIN WITH MINERALS) tablet Take 1 tablet by mouth daily.    . Probiotic Product (ALIGN PO) Take by mouth.    . RESVERATROL PO Take 1 tablet by mouth daily.    Marland Kitchen thiamine (VITAMIN B-1) 100 MG tablet Take 100 mg by mouth daily.    . valACYclovir (VALTREX) 1000 MG tablet Take 1 g by mouth as needed. Takes as needed for oral cold sores    . Estradiol 10 MCG TABS vaginal tablet Place  one tablet (10 mcg)  in the vagina at bedtime for 2 weeks and then one in the vaginl at bedtime twice a week. 34 tablet 3   No current facility-administered medications for this visit.     Family History  Problem Relation Age of Onset  . Stroke Mother 3    dec  . Diabetes Mother   . Cancer Father 43    dec colon ca  . Colon cancer Father   . Other Brother 30    dec-MVA  . Multiple sclerosis Sister   . Multiple sclerosis Sister   . Colitis Sister   . Stroke Maternal Aunt   . Rectal cancer Neg Hx   . Stomach cancer Neg Hx     ROS:  Pertinent items are noted in HPI.  Otherwise, a comprehensive ROS was negative.  Exam:   BP 110/74 (BP Location: Right Arm, Patient Position: Sitting, Cuff Size: Normal)    Pulse 76   Resp 16   Ht 5' 4.25" (1.632 m)   Wt 125 lb 6.4 oz (56.9 kg)   LMP 07/30/1983   BMI 21.36 kg/m     General appearance: alert, cooperative and appears stated age Head: Normocephalic, without obvious abnormality, atraumatic Neck: no adenopathy, supple, symmetrical, trachea midline and thyroid normal to inspection and palpation Lungs: clear to auscultation bilaterally Breasts: bilateral implants.  Left breast: 4 mm mass at 6:00, no nipple discharge, no axillary adenopathy.  Right breast with masses about 4 mm each at 7 and 8:00, no nipple discharge, no axillary nodes. Heart: regular rate and rhythm Abdomen: soft, non-tender; no masses, no organomegaly Extremities: extremities normal, atraumatic, no cyanosis or edema Skin: Skin color, texture, turgor normal. No rashes or lesions Lymph nodes: Cervical, supraclavicular, and axillary nodes normal. No abnormal inguinal nodes palpated Neurologic: Grossly normal  Pelvic: External genitalia:  no lesions              Urethra:  normal appearing urethra with no masses, tenderness or lesions              Bartholins and Skenes: normal                 Vagina: normal appearing vagina with normal color and discharge, no lesions              Cervix: no lesions              Pap taken: Yes.   Bimanual Exam:  Uterus:  normal size, contour, position, consistency, mobility, non-tender              Adnexa: no mass, fullness, tenderness              Rectal exam: Yes.  .  Confirms.              Anus:  normal sphincter tone, no lesions  Chaperone was present for exam.  Assessment:   Well woman visit with normal exam. HRT patient. Hx breast implants. Bilateral breast lumps  - right at 7:00 and 8:00 and left at 6:00. Status post hysteroscopy with dilation and curettage for postmenopausal bleeding 2015.  Genuine stress incontinence.  Plan: Dx bilateral mammogram and ultrasounds of breast.  Recommended self breast exam.  Pap and HR HPV as  above. Discussed Calcium, Vitamin D, regular exercise program including cardiovascular and weight bearing exercise. Stop HRT.  We reviewed the [facility] first. Will start Vagifem.  Discussed increased risk of breast cancer.  Will get copy of BMD.  Follow up annually and prn.     After visit summary provided.

## 2016-07-15 ENCOUNTER — Ambulatory Visit (INDEPENDENT_AMBULATORY_CARE_PROVIDER_SITE_OTHER): Payer: Medicare Other | Admitting: Obstetrics and Gynecology

## 2016-07-15 ENCOUNTER — Encounter: Payer: Self-pay | Admitting: Obstetrics and Gynecology

## 2016-07-15 VITALS — BP 110/74 | HR 76 | Resp 16 | Ht 64.25 in | Wt 125.4 lb

## 2016-07-15 DIAGNOSIS — Z01419 Encounter for gynecological examination (general) (routine) without abnormal findings: Secondary | ICD-10-CM

## 2016-07-15 DIAGNOSIS — N6310 Unspecified lump in the right breast, unspecified quadrant: Secondary | ICD-10-CM | POA: Diagnosis not present

## 2016-07-15 DIAGNOSIS — N6320 Unspecified lump in the left breast, unspecified quadrant: Secondary | ICD-10-CM | POA: Diagnosis not present

## 2016-07-15 MED ORDER — ESTRADIOL 10 MCG VA TABS: ORAL_TABLET | VAGINAL | 3 refills | Status: DC

## 2016-07-15 NOTE — Patient Instructions (Signed)

## 2016-07-15 NOTE — Progress Notes (Signed)
Spoke with Christine Romero at [facility]. Patient scheduled while in office for bilateral diagnostic MMG and bilateral breast Ultrasounds -07/18/16 at 2pm. Patient is agreeable to date and time. Order faxed to [facility].

## 2016-07-17 LAB — IPS PAP SMEAR ONLY

## 2016-07-30 ENCOUNTER — Telehealth: Payer: Self-pay | Admitting: *Deleted

## 2016-07-30 NOTE — Telephone Encounter (Signed)
Spoke with patient. Advised of BMD results per Dr. Quincy Simmonds. " Please report mild osteopenia to patient. No Rx needed. Continue weight bearing exercise, calcium, Vit D. Repeat study in 2-3 years". Patient verbalizes understanding and is agreeable. Patient states she received prescription for Vagifem at last AEX 07/15/16 to [facility]. Patient states she called last week to request  "hard copy" of prescription to take to another pharmacy to compare prices and coverage at other pharmacies. Patient states she spoke with Lovena Le. Patient states she has not received this prescription yet. RN advised will f/u and return call, patient is agreeable.

## 2016-07-31 ENCOUNTER — Other Ambulatory Visit: Payer: Self-pay | Admitting: Obstetrics and Gynecology

## 2016-07-31 MED ORDER — ESTRADIOL 10 MCG VA TABS: ORAL_TABLET | VAGINAL | 3 refills | Status: DC

## 2016-07-31 NOTE — Telephone Encounter (Signed)
Spoke with patient, advised as seen below per Dr. Quincy Simmonds. Patient states she would prefer to have prescription mailed to home. Verified mailing address. Patient verbalizes understanding and is agreeable.  Routing to provider for final review. Patient is agreeable to disposition. Will close encounter.

## 2016-07-31 NOTE — Telephone Encounter (Signed)
I just printed this Rx. Ok to contact patient to pick it up or have it mailed to her.

## 2016-08-01 ENCOUNTER — Encounter: Payer: Self-pay | Admitting: Obstetrics and Gynecology

## 2017-01-23 DIAGNOSIS — N6001 Solitary cyst of right breast: Secondary | ICD-10-CM | POA: Diagnosis not present

## 2017-01-23 DIAGNOSIS — N6320 Unspecified lump in the left breast, unspecified quadrant: Secondary | ICD-10-CM | POA: Diagnosis not present

## 2017-05-02 DIAGNOSIS — Z23 Encounter for immunization: Secondary | ICD-10-CM | POA: Diagnosis not present

## 2017-06-03 DIAGNOSIS — H524 Presbyopia: Secondary | ICD-10-CM | POA: Diagnosis not present

## 2017-06-03 DIAGNOSIS — H35023 Exudative retinopathy, bilateral: Secondary | ICD-10-CM | POA: Diagnosis not present

## 2017-06-03 DIAGNOSIS — D3121 Benign neoplasm of right retina: Secondary | ICD-10-CM | POA: Diagnosis not present

## 2017-06-24 DIAGNOSIS — M19041 Primary osteoarthritis, right hand: Secondary | ICD-10-CM | POA: Diagnosis not present

## 2017-06-24 DIAGNOSIS — Z1389 Encounter for screening for other disorder: Secondary | ICD-10-CM | POA: Diagnosis not present

## 2017-06-24 DIAGNOSIS — F17211 Nicotine dependence, cigarettes, in remission: Secondary | ICD-10-CM | POA: Diagnosis not present

## 2017-06-24 DIAGNOSIS — Z0000 Encounter for general adult medical examination without abnormal findings: Secondary | ICD-10-CM | POA: Diagnosis not present

## 2017-06-24 DIAGNOSIS — I700 Atherosclerosis of aorta: Secondary | ICD-10-CM | POA: Diagnosis not present

## 2017-06-24 DIAGNOSIS — F5104 Psychophysiologic insomnia: Secondary | ICD-10-CM | POA: Diagnosis not present

## 2017-06-24 DIAGNOSIS — M19042 Primary osteoarthritis, left hand: Secondary | ICD-10-CM | POA: Diagnosis not present

## 2017-06-27 ENCOUNTER — Other Ambulatory Visit: Payer: Self-pay | Admitting: Internal Medicine

## 2017-06-27 DIAGNOSIS — F17201 Nicotine dependence, unspecified, in remission: Secondary | ICD-10-CM

## 2017-07-07 ENCOUNTER — Ambulatory Visit: Payer: Medicare Other

## 2017-07-10 ENCOUNTER — Ambulatory Visit: Payer: Self-pay

## 2017-07-21 ENCOUNTER — Ambulatory Visit
Admission: RE | Admit: 2017-07-21 | Discharge: 2017-07-21 | Disposition: A | Discharge status: Home / Self Care | Payer: PPO | Source: Ambulatory Visit | Attending: Internal Medicine | Admitting: Internal Medicine

## 2017-07-21 DIAGNOSIS — F17201 Nicotine dependence, unspecified, in remission: Secondary | ICD-10-CM

## 2017-07-21 DIAGNOSIS — Z87891 Personal history of nicotine dependence: Secondary | ICD-10-CM | POA: Diagnosis not present

## 2017-07-23 ENCOUNTER — Other Ambulatory Visit: Payer: Self-pay | Admitting: Internal Medicine

## 2017-07-23 DIAGNOSIS — R911 Solitary pulmonary nodule: Secondary | ICD-10-CM

## 2017-07-28 ENCOUNTER — Ambulatory Visit: Payer: Medicare Other | Admitting: Obstetrics and Gynecology

## 2017-08-01 ENCOUNTER — Other Ambulatory Visit: Payer: Self-pay | Admitting: Obstetrics and Gynecology

## 2017-08-01 NOTE — Telephone Encounter (Signed)
Medication refill request: Christine Romero  Last AEX:  07/15/16 Dr. Quincy Simmonds  Next AEX: none Last MMG (if hormonal medication request): 01/23/17 BIRADS3:Probably benign. F/u 6 months. Has appt with [facility] 08/05/17 Refill authorized: 07/31/16 #34tabs/ 3 refills. Today #8/0R?

## 2017-08-04 NOTE — Telephone Encounter (Signed)
Left voicemail for pt to call back and schedule AEX.

## 2017-08-04 NOTE — Telephone Encounter (Signed)
RF done for 90 day supply but she does need AEX scheduled.

## 2017-08-05 DIAGNOSIS — Z09 Encounter for follow-up examination after completed treatment for conditions other than malignant neoplasm: Secondary | ICD-10-CM | POA: Diagnosis not present

## 2017-08-05 DIAGNOSIS — N6320 Unspecified lump in the left breast, unspecified quadrant: Secondary | ICD-10-CM | POA: Diagnosis not present

## 2017-09-02 ENCOUNTER — Telehealth: Payer: Self-pay | Admitting: *Deleted

## 2017-09-02 NOTE — Telephone Encounter (Signed)
Patient in 04 recall for 06/2017. Please contact patient regarding scheduling follow up breast imaging.  Thanks

## 2017-09-08 NOTE — Telephone Encounter (Signed)
Spoke with Dedra Skeens at [facility] and she will fax Br.U/s from 12/2016 and MMG from 07/2017 to [place].

## 2017-09-22 ENCOUNTER — Encounter: Payer: Self-pay | Admitting: Obstetrics and Gynecology

## 2017-10-09 DIAGNOSIS — D1801 Hemangioma of skin and subcutaneous tissue: Secondary | ICD-10-CM | POA: Diagnosis not present

## 2017-10-09 DIAGNOSIS — L570 Actinic keratosis: Secondary | ICD-10-CM | POA: Diagnosis not present

## 2017-10-09 DIAGNOSIS — L821 Other seborrheic keratosis: Secondary | ICD-10-CM | POA: Diagnosis not present

## 2017-10-22 ENCOUNTER — Ambulatory Visit
Admission: RE | Admit: 2017-10-22 | Discharge: 2017-10-22 | Disposition: A | Discharge status: Home / Self Care | Payer: PPO | Source: Ambulatory Visit | Attending: Internal Medicine | Admitting: Internal Medicine

## 2017-10-22 ENCOUNTER — Other Ambulatory Visit: Payer: Self-pay | Admitting: Internal Medicine

## 2017-10-22 DIAGNOSIS — R911 Solitary pulmonary nodule: Secondary | ICD-10-CM

## 2017-11-03 DIAGNOSIS — R200 Anesthesia of skin: Secondary | ICD-10-CM | POA: Diagnosis not present

## 2017-11-03 DIAGNOSIS — Z131 Encounter for screening for diabetes mellitus: Secondary | ICD-10-CM | POA: Diagnosis not present

## 2018-04-27 DIAGNOSIS — Z23 Encounter for immunization: Secondary | ICD-10-CM | POA: Diagnosis not present

## 2018-06-03 DIAGNOSIS — H35023 Exudative retinopathy, bilateral: Secondary | ICD-10-CM | POA: Diagnosis not present

## 2018-06-03 DIAGNOSIS — H524 Presbyopia: Secondary | ICD-10-CM | POA: Diagnosis not present

## 2018-06-04 DIAGNOSIS — L570 Actinic keratosis: Secondary | ICD-10-CM | POA: Diagnosis not present

## 2018-06-04 DIAGNOSIS — L814 Other melanin hyperpigmentation: Secondary | ICD-10-CM | POA: Diagnosis not present

## 2018-06-04 DIAGNOSIS — D485 Neoplasm of uncertain behavior of skin: Secondary | ICD-10-CM | POA: Diagnosis not present

## 2018-06-04 NOTE — Progress Notes (Signed)
72 y.o. G20P3003 [name] here for annual exam.    Has vaginal dryness.  Vagifem did not work.  She does not have intercourse often so she does not need this.  Has used oils externally.   No vaginal bleeding.   Going to the [facility] in January, 2020.  Going with church group.   [name], MD    Patient's last menstrual period was 07/30/1983.           Sexually active: No.  The current method of family planning is post menopausal status.    Exercising: Yes.    works outside daily Smoker:  Former--quit 2014  Health Maintenance: Pap: 07-15-16 Neg History of abnormal Pap:  no MMG: 08-05-17 3D Diag.Bil./density C/mass Lt.Br.--US revealed probably benign hypoechoic lesion/BiRads3.  Due in Jan, 2020 for mammogram and [place].  Colonoscopy: 2015 polyps;next due 2020 BMD:2017  Mild osteopenia of bilateral hips, normal spine. TDaP: PCP Gardasil:   no BMW:UXLKG ago--Neg Hep C: Neg 2017 Screening Labs:  Hb today: PCP Flu vaccine done.    reports that she has quit smoking. Her smoking use included cigarettes. She smoked 0.25 packs per day. She has never used smokeless tobacco. She reports that she does not drink alcohol or use drugs.  Past Medical History:  Diagnosis Date  . Arthritis    hands  . Breast lump   . Dyspareunia   . Osteoporosis   . SVD (spontaneous vaginal delivery)    x 3    Past Surgical History:  Procedure Laterality Date  . BREAST SURGERY     breast augmentation  . COLONOSCOPY    . DILATATION & CURETTAGE/HYSTEROSCOPY WITH MYOSURE N/A 05/31/2014   Procedure: DILATATION & CURETTAGE/ DIAGNOSTIC HYSTEROSCOPY;  Surgeon: Jamey Reas de Berton Lan, MD;  Location: [facility];  Service: Gynecology;  Laterality: N/A;  . gardners duct surgery  1972  . NASAL SEPTUM SURGERY  1980's  . POLYPECTOMY    . WISDOM TOOTH EXTRACTION      Current Outpatient Medications  Medication Sig Dispense Refill  . acyclovir ointment (ZOVIRAX) 5 % Apply 1  application topically as needed.    Marland Kitchen Apoaequorin (PREVAGEN PO) Take 1 tablet by mouth daily.    . Ascorbic Acid (VITAMIN C) 1000 MG tablet Take 1,000 mg by mouth daily.    Marland Kitchen aspirin EC 81 MG tablet Take 81 mg by mouth daily.    . calcium carbonate (OS-CAL) 600 MG TABS tablet Take 600 mg by mouth 2 (two) times daily with a meal.    . Cholecalciferol (VITAMIN D3) 1000 units CAPS Take 1 capsule by mouth daily.    . diphenhydrAMINE (BENADRYL) 25 mg capsule Take 50 mg by mouth at bedtime as needed for sleep.     Marland Kitchen ibuprofen (ADVIL,MOTRIN) 800 MG tablet Take 1 tablet (800 mg total) by mouth every 8 (eight) hours as needed. 30 tablet 0  . KRILL OIL PO Take 1 tablet by mouth daily.    . Multiple Vitamin (MULTIVITAMIN) capsule Take 1 capsule by mouth daily.    . Multiple Vitamins-Minerals (MULTIVITAMIN WITH MINERALS) tablet Take 1 tablet by mouth daily.    . Probiotic Product (ALIGN PO) Take by mouth.    . RESVERATROL PO Take 1 tablet by mouth daily.    Marland Kitchen thiamine (VITAMIN B-1) 100 MG tablet Take 100 mg by mouth daily.    . valACYclovir (VALTREX) 1000 MG tablet Take 1 g by mouth as needed. Takes as needed for  oral cold sores    . Estradiol (YUVAFEM) 10 MCG TABS vaginal tablet Insert 1 tablet vaginally twice weekly. (Patient not taking: Reported on 06/05/2018) 34 tablet 0   No current facility-administered medications for this visit.     Family History  Problem Relation Age of Onset  . Stroke Mother 59       dec  . Diabetes Mother   . Cancer Father 29       dec colon ca  . Colon cancer Father   . Other Brother 30       dec-MVA  . Multiple sclerosis Sister   . Multiple sclerosis Sister   . Colitis Sister   . Stroke Maternal Aunt   . Rectal cancer Neg Hx   . Stomach cancer Neg Hx     Review of Systems  All other systems reviewed and are negative.   Exam:   BP 126/78 (BP Location: Right Arm, Patient Position: Sitting, Cuff Size: Normal)   Pulse 70   Resp 14   Ht 5' 4.5" (1.638 m)    Wt 125 lb 9.6 oz (57 kg)   LMP 07/30/1983   BMI 21.23 kg/m     General appearance: alert, cooperative and appears stated age Head: Normocephalic, without obvious abnormality, atraumatic Neck: no adenopathy, supple, symmetrical, trachea midline and thyroid normal to inspection and palpation Lungs: clear to auscultation bilaterally Breasts: bilateral implants.  Right breast with 3 - 4 mm lump at 7 - 8:00.  Left breast with no masses or tenderness Both breasts with no nipple retraction or dimpling, No nipple discharge or bleeding, No axillary or supraclavicular adenopathy. Heart: regular rate and rhythm Abdomen: soft, non-tender; no masses, no organomegaly Extremities: extremities normal, atraumatic, no cyanosis or edema Skin: Skin color, texture, turgor normal. No rashes or lesions Lymph nodes: Cervical, supraclavicular, and axillary nodes normal. No abnormal inguinal nodes palpated Neurologic: Grossly normal  Pelvic: External genitalia:  no lesions              Urethra:  normal appearing urethra with no masses, tenderness or lesions              Bartholins and Skenes: normal                 Vagina: normal appearing vagina with normal color and discharge, no lesions              Cervix: no lesions              Pap taken: No. Bimanual Exam:  Uterus:  normal size, contour, position, consistency, mobility, non-tender              Adnexa: no mass, fullness, tenderness              Rectal exam: Yes.  .  Confirms.              Anus:  normal sphincter tone, no lesions  Chaperone was present for exam.  Assessment:   Well woman visit with normal exam. Hx breast implants. Breast lump right at 7:00 - 8:00 - benign cyst on imaging.  Also followed for left breast imaging abnormality.   Status post hysteroscopy with dilation and curettage for postmenopausal bleeding 2015.  Genuine stress incontinence. Osteopenia.   Plan: Mammogram dx and left breast US due in Jan. 2020. Recommended self  breast awareness. Pap and HR HPV as above. Guidelines for Calcium, Vitamin D, regular exercise program including cardiovascular and weight bearing exercise. BMD in  2020 or early 2021. Labs with PCP.   Follow up annually and prn.    After visit summary provided.

## 2018-06-05 ENCOUNTER — Ambulatory Visit (INDEPENDENT_AMBULATORY_CARE_PROVIDER_SITE_OTHER): Payer: PPO | Admitting: Obstetrics and Gynecology

## 2018-06-05 ENCOUNTER — Encounter: Payer: Self-pay | Admitting: Obstetrics and Gynecology

## 2018-06-05 ENCOUNTER — Other Ambulatory Visit: Payer: Self-pay

## 2018-06-05 VITALS — BP 126/78 | HR 70 | Resp 14 | Ht 64.5 in | Wt 125.6 lb

## 2018-06-05 DIAGNOSIS — Z01419 Encounter for gynecological examination (general) (routine) without abnormal findings: Secondary | ICD-10-CM | POA: Diagnosis not present

## 2018-06-05 NOTE — Patient Instructions (Signed)

## 2018-06-29 DIAGNOSIS — Z1389 Encounter for screening for other disorder: Secondary | ICD-10-CM | POA: Diagnosis not present

## 2018-06-29 DIAGNOSIS — Z0000 Encounter for general adult medical examination without abnormal findings: Secondary | ICD-10-CM | POA: Diagnosis not present

## 2018-06-29 DIAGNOSIS — R911 Solitary pulmonary nodule: Secondary | ICD-10-CM | POA: Diagnosis not present

## 2018-06-30 ENCOUNTER — Other Ambulatory Visit: Payer: Self-pay | Admitting: Internal Medicine

## 2018-06-30 DIAGNOSIS — R911 Solitary pulmonary nodule: Secondary | ICD-10-CM

## 2018-10-05 ENCOUNTER — Ambulatory Visit
Admission: RE | Admit: 2018-10-05 | Discharge: 2018-10-05 | Disposition: A | Discharge status: Home / Self Care | Payer: PPO | Source: Ambulatory Visit | Attending: Internal Medicine | Admitting: Internal Medicine

## 2018-10-05 DIAGNOSIS — R911 Solitary pulmonary nodule: Secondary | ICD-10-CM

## 2018-10-05 DIAGNOSIS — Z87891 Personal history of nicotine dependence: Secondary | ICD-10-CM | POA: Diagnosis not present

## 2018-10-13 DIAGNOSIS — L821 Other seborrheic keratosis: Secondary | ICD-10-CM | POA: Diagnosis not present

## 2018-10-13 DIAGNOSIS — L720 Epidermal cyst: Secondary | ICD-10-CM | POA: Diagnosis not present

## 2018-10-13 DIAGNOSIS — L820 Inflamed seborrheic keratosis: Secondary | ICD-10-CM | POA: Diagnosis not present

## 2018-10-13 DIAGNOSIS — D1801 Hemangioma of skin and subcutaneous tissue: Secondary | ICD-10-CM | POA: Diagnosis not present

## 2018-10-13 DIAGNOSIS — L814 Other melanin hyperpigmentation: Secondary | ICD-10-CM | POA: Diagnosis not present

## 2018-12-08 DIAGNOSIS — L245 Irritant contact dermatitis due to other chemical products: Secondary | ICD-10-CM | POA: Diagnosis not present

## 2019-02-25 ENCOUNTER — Telehealth: Payer: Self-pay

## 2019-02-25 NOTE — Telephone Encounter (Signed)
Tried calling patient in regards scheduling breast imaging. No answer, message left on patient's voicemail to give me a call back at [phone].   Spoke with [facility] to see if patient had breast imaging. Per [place], patient was scheduled in March 2020, however due to [place], patient cancelled appointment.

## 2019-04-13 ENCOUNTER — Encounter: Payer: Self-pay | Admitting: Obstetrics and Gynecology

## 2019-04-13 DIAGNOSIS — M8589 Other specified disorders of bone density and structure, multiple sites: Secondary | ICD-10-CM | POA: Diagnosis not present

## 2019-04-13 DIAGNOSIS — N6459 Other signs and symptoms in breast: Secondary | ICD-10-CM | POA: Diagnosis not present

## 2019-04-13 DIAGNOSIS — R29890 Loss of height: Secondary | ICD-10-CM | POA: Diagnosis not present

## 2019-04-13 DIAGNOSIS — N6325 Unspecified lump in the left breast, overlapping quadrants: Secondary | ICD-10-CM | POA: Diagnosis not present

## 2019-05-01 DIAGNOSIS — Z23 Encounter for immunization: Secondary | ICD-10-CM | POA: Diagnosis not present

## 2019-05-19 ENCOUNTER — Encounter: Payer: Self-pay | Admitting: Gastroenterology

## 2019-06-08 DIAGNOSIS — H524 Presbyopia: Secondary | ICD-10-CM | POA: Diagnosis not present

## 2019-06-08 DIAGNOSIS — H35023 Exudative retinopathy, bilateral: Secondary | ICD-10-CM | POA: Diagnosis not present

## 2019-06-22 ENCOUNTER — Telehealth: Payer: Self-pay

## 2019-06-22 NOTE — Telephone Encounter (Signed)
Patient in 04 recall for left breast ultrasound. Please contact to schedule.

## 2019-07-01 DIAGNOSIS — Z86010 Personal history of colonic polyps: Secondary | ICD-10-CM | POA: Diagnosis not present

## 2019-07-01 DIAGNOSIS — Z13220 Encounter for screening for lipoid disorders: Secondary | ICD-10-CM | POA: Diagnosis not present

## 2019-07-01 DIAGNOSIS — L989 Disorder of the skin and subcutaneous tissue, unspecified: Secondary | ICD-10-CM | POA: Diagnosis not present

## 2019-07-01 DIAGNOSIS — R202 Paresthesia of skin: Secondary | ICD-10-CM | POA: Diagnosis not present

## 2019-07-01 DIAGNOSIS — Z0000 Encounter for general adult medical examination without abnormal findings: Secondary | ICD-10-CM | POA: Diagnosis not present

## 2019-07-01 DIAGNOSIS — R7301 Impaired fasting glucose: Secondary | ICD-10-CM | POA: Diagnosis not present

## 2019-07-01 DIAGNOSIS — Z1389 Encounter for screening for other disorder: Secondary | ICD-10-CM | POA: Diagnosis not present

## 2019-07-07 NOTE — Telephone Encounter (Signed)
Spoke with [facility] and patient did have Lt.Br.U/S and Diag.MMG 03/2019. She will fax reports.

## 2019-07-07 NOTE — Telephone Encounter (Signed)
Received Diag.MMG and Lt.Br.U/S report dated 04-13-19 received from [facility]. Results given to [place].

## 2019-07-13 NOTE — Telephone Encounter (Signed)
Patient removed from current MMG recall.   Copy of MMG report dated 04/13/19 to scan.   Encounter closed.

## 2019-08-26 ENCOUNTER — Ambulatory Visit: Payer: PPO

## 2019-09-01 DIAGNOSIS — Z86010 Personal history of colonic polyps: Secondary | ICD-10-CM | POA: Diagnosis not present

## 2019-09-03 ENCOUNTER — Ambulatory Visit: Payer: PPO

## 2019-09-06 ENCOUNTER — Ambulatory Visit: Payer: PPO

## 2019-09-07 DIAGNOSIS — Z1159 Encounter for screening for other viral diseases: Secondary | ICD-10-CM | POA: Diagnosis not present

## 2019-09-10 DIAGNOSIS — Z86010 Personal history of colonic polyps: Secondary | ICD-10-CM | POA: Diagnosis not present

## 2019-09-10 DIAGNOSIS — D122 Benign neoplasm of ascending colon: Secondary | ICD-10-CM | POA: Diagnosis not present

## 2019-09-10 DIAGNOSIS — K648 Other hemorrhoids: Secondary | ICD-10-CM | POA: Diagnosis not present

## 2019-09-10 DIAGNOSIS — K621 Rectal polyp: Secondary | ICD-10-CM | POA: Diagnosis not present

## 2019-09-10 DIAGNOSIS — D123 Benign neoplasm of transverse colon: Secondary | ICD-10-CM | POA: Diagnosis not present

## 2019-09-14 DIAGNOSIS — D123 Benign neoplasm of transverse colon: Secondary | ICD-10-CM | POA: Diagnosis not present

## 2019-09-14 DIAGNOSIS — K621 Rectal polyp: Secondary | ICD-10-CM | POA: Diagnosis not present

## 2019-09-14 DIAGNOSIS — D122 Benign neoplasm of ascending colon: Secondary | ICD-10-CM | POA: Diagnosis not present

## 2019-10-06 DIAGNOSIS — L821 Other seborrheic keratosis: Secondary | ICD-10-CM | POA: Diagnosis not present

## 2019-10-06 DIAGNOSIS — D0361 Melanoma in situ of right upper limb, including shoulder: Secondary | ICD-10-CM | POA: Diagnosis not present

## 2019-10-06 DIAGNOSIS — L820 Inflamed seborrheic keratosis: Secondary | ICD-10-CM | POA: Diagnosis not present

## 2019-10-06 DIAGNOSIS — D225 Melanocytic nevi of trunk: Secondary | ICD-10-CM | POA: Diagnosis not present

## 2019-10-06 DIAGNOSIS — L570 Actinic keratosis: Secondary | ICD-10-CM | POA: Diagnosis not present

## 2019-10-06 DIAGNOSIS — D485 Neoplasm of uncertain behavior of skin: Secondary | ICD-10-CM | POA: Diagnosis not present

## 2019-10-06 DIAGNOSIS — L814 Other melanin hyperpigmentation: Secondary | ICD-10-CM | POA: Diagnosis not present

## 2019-10-25 DIAGNOSIS — L988 Other specified disorders of the skin and subcutaneous tissue: Secondary | ICD-10-CM | POA: Diagnosis not present

## 2019-10-25 DIAGNOSIS — D0361 Melanoma in situ of right upper limb, including shoulder: Secondary | ICD-10-CM | POA: Diagnosis not present

## 2019-11-04 DIAGNOSIS — Z4802 Encounter for removal of sutures: Secondary | ICD-10-CM | POA: Diagnosis not present

## 2020-01-07 ENCOUNTER — Other Ambulatory Visit: Payer: Self-pay | Admitting: Internal Medicine

## 2020-01-07 DIAGNOSIS — R05 Cough: Secondary | ICD-10-CM | POA: Diagnosis not present

## 2020-01-07 DIAGNOSIS — R911 Solitary pulmonary nodule: Secondary | ICD-10-CM | POA: Diagnosis not present

## 2020-01-25 DIAGNOSIS — R1013 Epigastric pain: Secondary | ICD-10-CM | POA: Diagnosis not present

## 2020-01-25 DIAGNOSIS — R911 Solitary pulmonary nodule: Secondary | ICD-10-CM | POA: Diagnosis not present

## 2020-01-25 DIAGNOSIS — R05 Cough: Secondary | ICD-10-CM | POA: Diagnosis not present

## 2020-01-26 ENCOUNTER — Other Ambulatory Visit: Payer: Self-pay

## 2020-01-26 ENCOUNTER — Ambulatory Visit
Admission: RE | Admit: 2020-01-26 | Discharge: 2020-01-26 | Disposition: A | Discharge status: Home / Self Care | Payer: PPO | Source: Ambulatory Visit | Attending: Internal Medicine | Admitting: Internal Medicine

## 2020-01-26 DIAGNOSIS — R911 Solitary pulmonary nodule: Secondary | ICD-10-CM | POA: Diagnosis not present

## 2020-03-31 DIAGNOSIS — Z85820 Personal history of malignant melanoma of skin: Secondary | ICD-10-CM | POA: Diagnosis not present

## 2020-03-31 DIAGNOSIS — D1801 Hemangioma of skin and subcutaneous tissue: Secondary | ICD-10-CM | POA: Diagnosis not present

## 2020-03-31 DIAGNOSIS — L570 Actinic keratosis: Secondary | ICD-10-CM | POA: Diagnosis not present

## 2020-03-31 DIAGNOSIS — L814 Other melanin hyperpigmentation: Secondary | ICD-10-CM | POA: Diagnosis not present

## 2020-03-31 DIAGNOSIS — L821 Other seborrheic keratosis: Secondary | ICD-10-CM | POA: Diagnosis not present

## 2020-07-06 DIAGNOSIS — Z0000 Encounter for general adult medical examination without abnormal findings: Secondary | ICD-10-CM | POA: Diagnosis not present

## 2020-07-06 DIAGNOSIS — F17201 Nicotine dependence, unspecified, in remission: Secondary | ICD-10-CM | POA: Diagnosis not present

## 2020-07-06 DIAGNOSIS — I700 Atherosclerosis of aorta: Secondary | ICD-10-CM | POA: Diagnosis not present

## 2020-07-06 DIAGNOSIS — Z23 Encounter for immunization: Secondary | ICD-10-CM | POA: Diagnosis not present

## 2020-07-06 DIAGNOSIS — R911 Solitary pulmonary nodule: Secondary | ICD-10-CM | POA: Diagnosis not present

## 2020-07-06 DIAGNOSIS — R202 Paresthesia of skin: Secondary | ICD-10-CM | POA: Diagnosis not present

## 2020-07-06 DIAGNOSIS — R7301 Impaired fasting glucose: Secondary | ICD-10-CM | POA: Diagnosis not present

## 2020-07-06 DIAGNOSIS — Z1389 Encounter for screening for other disorder: Secondary | ICD-10-CM | POA: Diagnosis not present

## 2020-07-07 ENCOUNTER — Other Ambulatory Visit: Payer: Self-pay | Admitting: Internal Medicine

## 2020-07-07 DIAGNOSIS — F17211 Nicotine dependence, cigarettes, in remission: Secondary | ICD-10-CM

## 2020-08-31 ENCOUNTER — Ambulatory Visit: Payer: PPO

## 2020-09-14 DIAGNOSIS — R350 Frequency of micturition: Secondary | ICD-10-CM | POA: Diagnosis not present

## 2020-10-26 DIAGNOSIS — C44519 Basal cell carcinoma of skin of other part of trunk: Secondary | ICD-10-CM | POA: Diagnosis not present

## 2020-10-26 DIAGNOSIS — Z85820 Personal history of malignant melanoma of skin: Secondary | ICD-10-CM | POA: Diagnosis not present

## 2020-10-26 DIAGNOSIS — D485 Neoplasm of uncertain behavior of skin: Secondary | ICD-10-CM | POA: Diagnosis not present

## 2020-10-26 DIAGNOSIS — L821 Other seborrheic keratosis: Secondary | ICD-10-CM | POA: Diagnosis not present

## 2020-10-26 DIAGNOSIS — L814 Other melanin hyperpigmentation: Secondary | ICD-10-CM | POA: Diagnosis not present

## 2020-10-26 DIAGNOSIS — L570 Actinic keratosis: Secondary | ICD-10-CM | POA: Diagnosis not present

## 2020-11-30 DIAGNOSIS — L988 Other specified disorders of the skin and subcutaneous tissue: Secondary | ICD-10-CM | POA: Diagnosis not present

## 2020-11-30 DIAGNOSIS — C44519 Basal cell carcinoma of skin of other part of trunk: Secondary | ICD-10-CM | POA: Diagnosis not present

## 2020-12-27 ENCOUNTER — Ambulatory Visit
Admission: RE | Admit: 2020-12-27 | Discharge: 2020-12-27 | Disposition: A | Discharge status: Home / Self Care | Payer: PPO | Source: Ambulatory Visit | Attending: Internal Medicine | Admitting: Internal Medicine

## 2020-12-27 DIAGNOSIS — J432 Centrilobular emphysema: Secondary | ICD-10-CM | POA: Diagnosis not present

## 2020-12-27 DIAGNOSIS — Z87891 Personal history of nicotine dependence: Secondary | ICD-10-CM | POA: Diagnosis not present

## 2020-12-27 DIAGNOSIS — I700 Atherosclerosis of aorta: Secondary | ICD-10-CM | POA: Diagnosis not present

## 2020-12-27 DIAGNOSIS — F17211 Nicotine dependence, cigarettes, in remission: Secondary | ICD-10-CM

## 2020-12-27 DIAGNOSIS — I2510 Atherosclerotic heart disease of native coronary artery without angina pectoris: Secondary | ICD-10-CM | POA: Diagnosis not present

## 2021-02-09 IMAGING — CT CT CHEST LUNG CANCER SCREENING LOW DOSE
2 of 5 series · 15 of 40 positions shown, 18 images · non-contrast
Comparison: Low-dose lung cancer screening chest CT 10/22/2017.

CLINICAL DATA: 72-year-old female former smoker (quit 7 years ago)
with 30 pack-year history of smoking. Lung cancer screening
examination.

EXAM:
CT CHEST WITHOUT CONTRAST LOW-DOSE FOR LUNG CANCER SCREENING
TECHNIQUE: Multidetector CT imaging of the chest was performed following the
standard protocol without IV contrast.

[Series 4: lung 1.00 br44 cor · coronal · 0.67mm/px · 3 of 307 slices shown]
[im 62/307  lung]
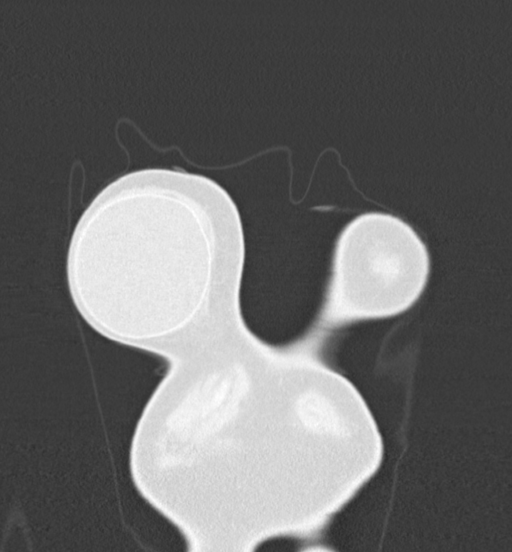
[im 123/307  lung]
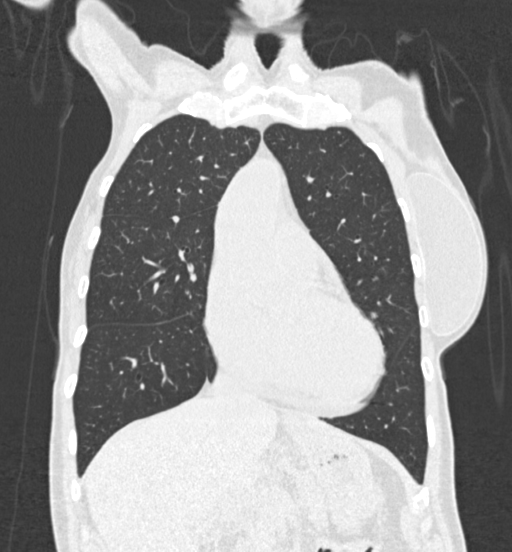
[im 184/307  lung]
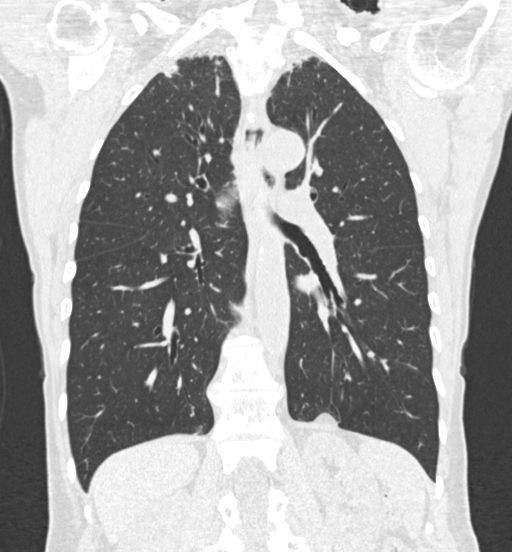

[Series 9: lung 1.00 br60 · axial · 0.67mm/px · z∈[-1239,-910]mm · 12 of 369 slices shown, 15 images]
[im 20/369  mediastinal]
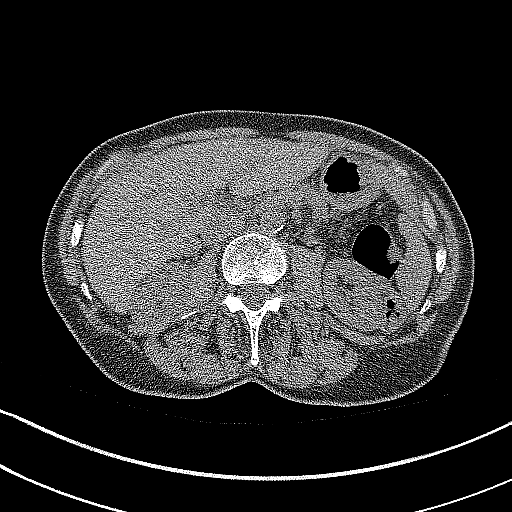
[im 20/369  lung]
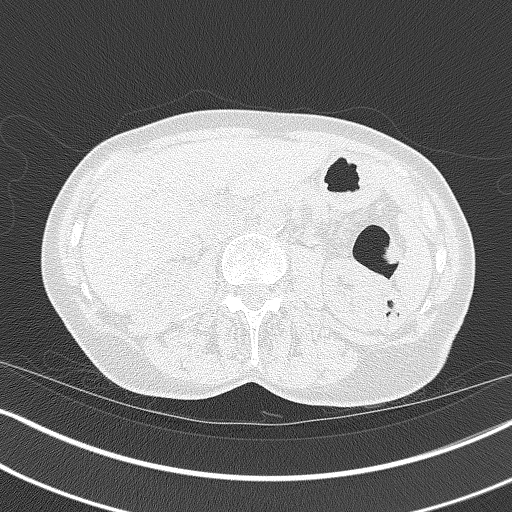
[im 59/369  lung]
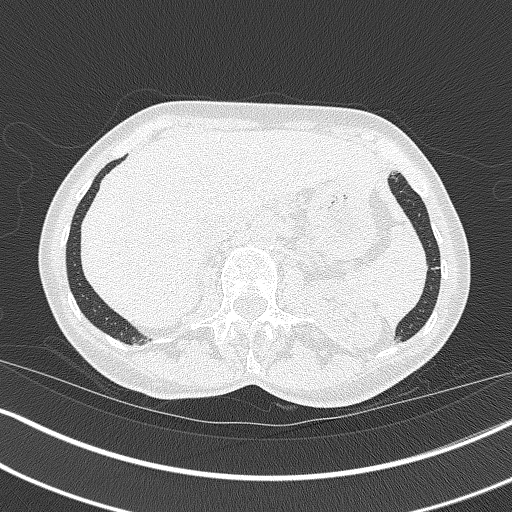
[im 78/369  lung]
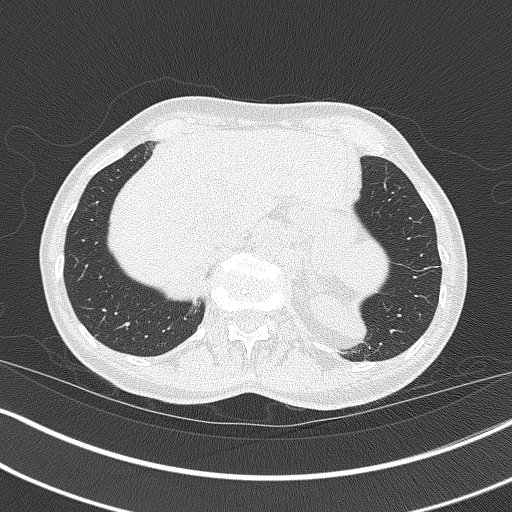
[im 117/369  lung]
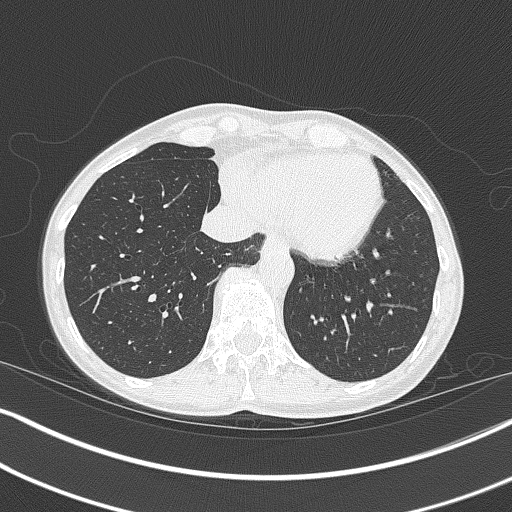
[im 136/369  mediastinal]
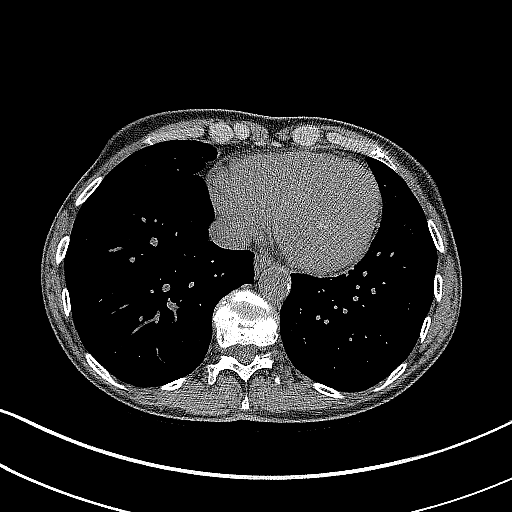
[im 136/369  lung]
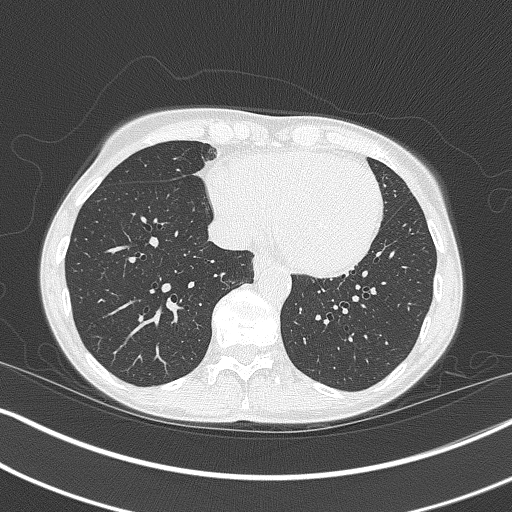
[im 175/369  lung]
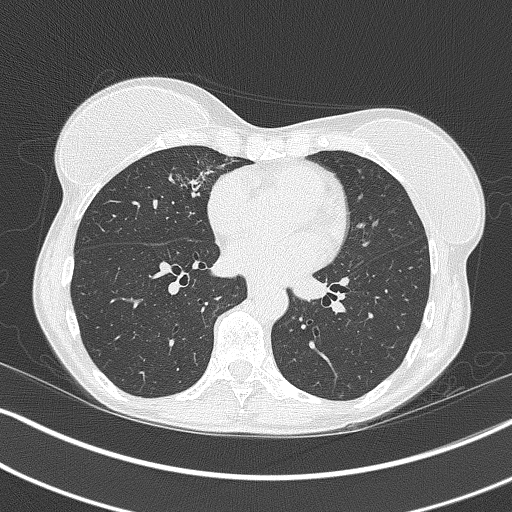
[im 194/369  lung]
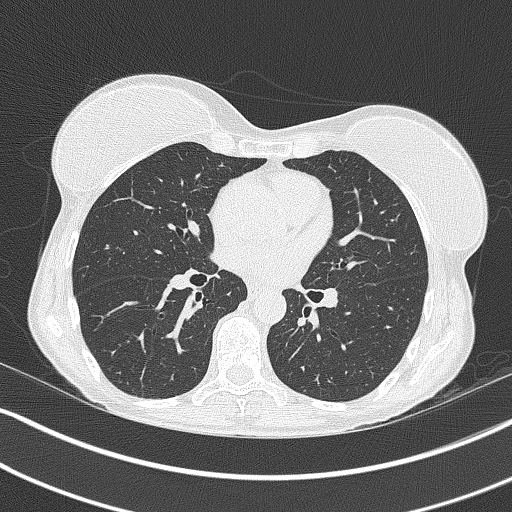
[im 233/369  lung]
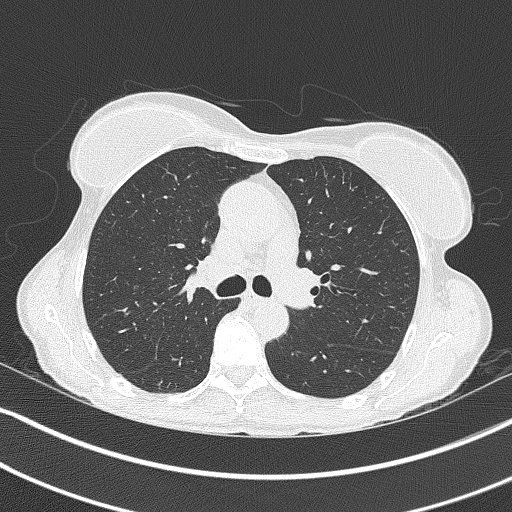
[im 252/369  mediastinal]
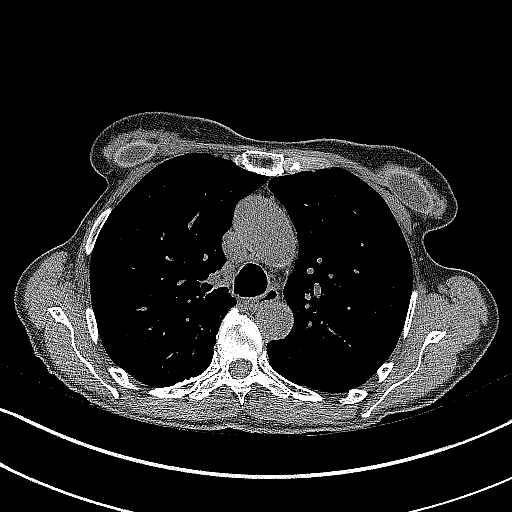
[im 252/369  lung]
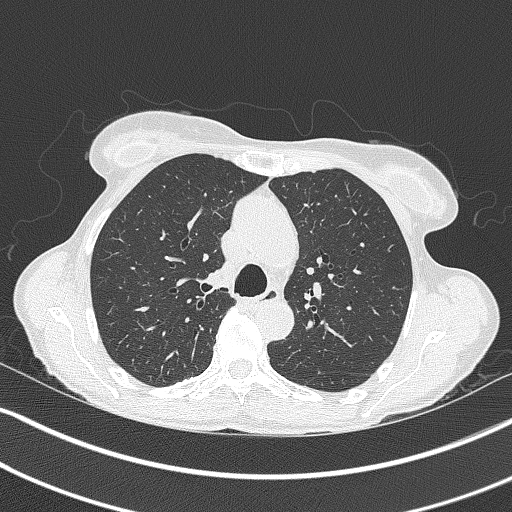
[im 291/369  lung]
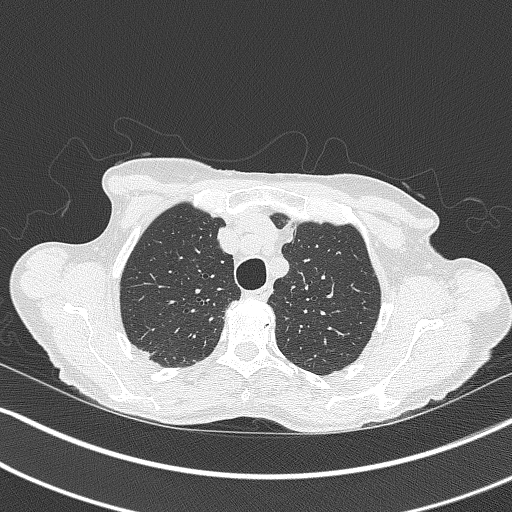
[im 310/369  lung]
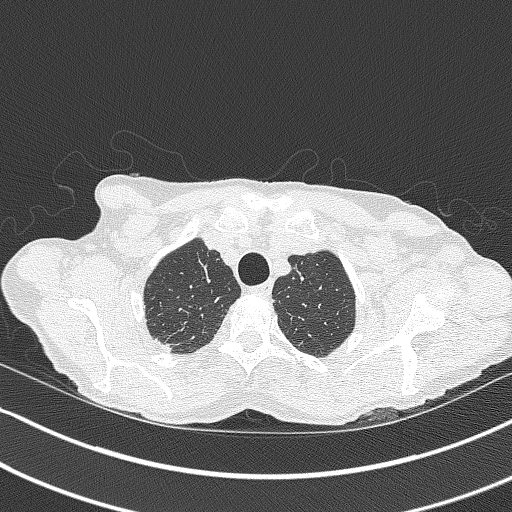
[im 349/369  lung]
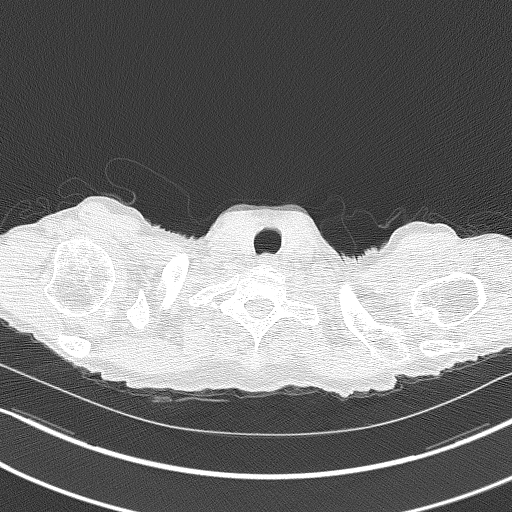

[15 of 40 positions shown; findings below may reference images not displayed]

FINDINGS: Cardiovascular: Heart size is normal. There is no significant
pericardial fluid, thickening or pericardial calcification.
Atherosclerotic calcifications in the thoracic aorta. No definite
coronary artery calcifications. Mild ectasia of the ascending
thoracic aorta (4.2 cm in diameter).

Mediastinum/Nodes: No pathologically enlarged mediastinal or hilar
lymph nodes. Please note that accurate exclusion of hilar adenopathy
is limited on noncontrast CT scans. Esophagus is unremarkable in
appearance. No axillary lymphadenopathy.

Lungs/Pleura: Multiple small pulmonary nodules are again noted
throughout the lungs bilaterally. In addition, there is a larger
nodular area of architectural distortion in the medial aspect of the
right middle lobe which appears very similar to prior studies dating
back to 9028, presumably an area of chronic post infectious or
inflammatory scarring. No acute consolidative airspace disease. No
pleural effusions. Bilateral apical nodular areas of
pleuroparenchymal thickening and architectural distortion, most
compatible with areas of chronic post infectious or inflammatory
scarring. Mild diffuse bronchial wall thickening with very mild
centrilobular and paraseptal emphysema.

Upper Abdomen: Aortic atherosclerosis.

Musculoskeletal: Bilateral breast implants are incidentally noted.
There are no aggressive appearing lytic or blastic lesions noted in
the visualized portions of the skeleton.
IMPRESSION: 1. Lung-RADS 2S, benign appearance or behavior. Continue annual
screening with low-dose chest CT without contrast in 12 months.
2. Aortic atherosclerosis with ectasia of the ascending thoracic
aorta (4.2 cm in diameter). Recommend annual imaging followup by CTA
or MRA. This recommendation follows 6232
ACCF/AHA/AATS/ACR/ASA/SCA/HARACHI/SOKI/KECSE-NAGY/DEEQA RAYAAN Guidelines for the
Diagnosis and Management of Patients with Thoracic Aortic Disease.
Circulation. 6232; 121: E266-e369. Aortic aneurysm NOS
(YH689-6CD.2).
3. Mild diffuse bronchial wall thickening with very mild
centrilobular and paraseptal emphysema; imaging findings suggestive
of underlying COPD.

Aortic Atherosclerosis (YH689-7DQ.Q) and Emphysema (YH689-BQ3.3).

## 2021-04-03 DIAGNOSIS — Z85828 Personal history of other malignant neoplasm of skin: Secondary | ICD-10-CM | POA: Diagnosis not present

## 2021-04-03 DIAGNOSIS — Z85820 Personal history of malignant melanoma of skin: Secondary | ICD-10-CM | POA: Diagnosis not present

## 2021-04-03 DIAGNOSIS — L570 Actinic keratosis: Secondary | ICD-10-CM | POA: Diagnosis not present

## 2021-04-03 DIAGNOSIS — L821 Other seborrheic keratosis: Secondary | ICD-10-CM | POA: Diagnosis not present

## 2021-04-03 DIAGNOSIS — L814 Other melanin hyperpigmentation: Secondary | ICD-10-CM | POA: Diagnosis not present

## 2021-07-13 DIAGNOSIS — R200 Anesthesia of skin: Secondary | ICD-10-CM | POA: Diagnosis not present

## 2021-07-13 DIAGNOSIS — F5104 Psychophysiologic insomnia: Secondary | ICD-10-CM | POA: Diagnosis not present

## 2021-07-13 DIAGNOSIS — R7301 Impaired fasting glucose: Secondary | ICD-10-CM | POA: Diagnosis not present

## 2021-07-13 DIAGNOSIS — Z0000 Encounter for general adult medical examination without abnormal findings: Secondary | ICD-10-CM | POA: Diagnosis not present

## 2021-07-13 DIAGNOSIS — R1312 Dysphagia, oropharyngeal phase: Secondary | ICD-10-CM | POA: Diagnosis not present

## 2021-07-13 DIAGNOSIS — R059 Cough, unspecified: Secondary | ICD-10-CM | POA: Diagnosis not present

## 2021-07-13 DIAGNOSIS — Z23 Encounter for immunization: Secondary | ICD-10-CM | POA: Diagnosis not present

## 2021-07-13 DIAGNOSIS — I700 Atherosclerosis of aorta: Secondary | ICD-10-CM | POA: Diagnosis not present

## 2021-07-13 DIAGNOSIS — Z1389 Encounter for screening for other disorder: Secondary | ICD-10-CM | POA: Diagnosis not present

## 2021-07-13 DIAGNOSIS — Z86010 Personal history of colonic polyps: Secondary | ICD-10-CM | POA: Diagnosis not present

## 2021-07-13 DIAGNOSIS — F17211 Nicotine dependence, cigarettes, in remission: Secondary | ICD-10-CM | POA: Diagnosis not present

## 2021-07-17 ENCOUNTER — Telehealth (HOSPITAL_COMMUNITY): Payer: Self-pay

## 2021-07-17 DIAGNOSIS — H35023 Exudative retinopathy, bilateral: Secondary | ICD-10-CM | POA: Diagnosis not present

## 2021-07-17 NOTE — Telephone Encounter (Signed)
Attempted to contact patient to schedule OP MBS - left voicemail. ?

## 2021-07-25 ENCOUNTER — Other Ambulatory Visit (HOSPITAL_COMMUNITY): Payer: Self-pay

## 2021-07-25 DIAGNOSIS — R1310 Dysphagia, unspecified: Secondary | ICD-10-CM

## 2021-08-06 ENCOUNTER — Ambulatory Visit (HOSPITAL_COMMUNITY)
Admission: RE | Admit: 2021-08-06 | Discharge: 2021-08-06 | Disposition: A | Discharge status: Home / Self Care | Payer: PPO | Source: Ambulatory Visit | Attending: Internal Medicine | Admitting: Internal Medicine

## 2021-08-06 ENCOUNTER — Other Ambulatory Visit: Payer: Self-pay

## 2021-08-06 DIAGNOSIS — R1312 Dysphagia, oropharyngeal phase: Secondary | ICD-10-CM | POA: Insufficient documentation

## 2021-08-06 DIAGNOSIS — R059 Cough, unspecified: Secondary | ICD-10-CM | POA: Insufficient documentation

## 2021-08-06 DIAGNOSIS — R1310 Dysphagia, unspecified: Secondary | ICD-10-CM | POA: Diagnosis not present

## 2021-08-06 NOTE — Therapy (Signed)
Modified Barium Swallow Progress Note  Patient Details  Name: Christine Romero MRN: [id number] Date of Birth: [birth year]  Today's Date: 08/06/2021  Modified Barium Swallow completed.  Full report located under Chart Review in the Imaging Section.  Brief recommendations include the following:  Clinical Impression Pt seen in radiology for outpatient MBS due to concern for aspiration. Pt was awake, alert, pleasant and cooperative.   Oral stage is [facility] with adequate oral prep and timely clearing. No anterior leakage or residue noted across consistencies.   Pharyngeal swallow is WFL for pt age. Swallow reflex triggers at the level of the vallecular sinus across consistencies. Very trace flash penetration is noted intermittently during the swallow of thin liquids via cup. No aspiration of any consistency. There is no post-swallow residue on any texture.  Esophageal sweep reveals it to be slow to clear. Pt was encouraged to discuss with PCP taking PPI on a regular basis, rather than PRN. Pt was given written behavioral and dietary strategies for management of esophageal dysmotility as well. She was receptive to education regarding the possibility of spillage into the airway if esophageal contents back up that high. This could be the cause of her intermittent choking episodes.     Swallow Evaluation Recommendations  SLP Diet Recommendations: Regular solids;Thin liquid   Liquid Administration via: Cup;Straw   Medication Administration: Whole meds with liquid   Supervision: Patient able to self feed   Compensations: Slow rate;Small sips/bites;Minimize environmental distractions   Postural Changes: Seated upright at 90 degrees;Remain semi-upright after after feeds/meals (Comment)   Oral Care Recommendations: Oral care BID  Discuss with PCP taking PPI on a regular basis, rather than PRN.     [name] B. Quentin Ore, [facility], [place] Speech Language Pathologist Office: [phone]  Shonna Chock 08/06/2021,2:01 PM

## 2021-09-26 DIAGNOSIS — L820 Inflamed seborrheic keratosis: Secondary | ICD-10-CM | POA: Diagnosis not present

## 2021-09-26 DIAGNOSIS — L821 Other seborrheic keratosis: Secondary | ICD-10-CM | POA: Diagnosis not present

## 2021-09-26 DIAGNOSIS — D1801 Hemangioma of skin and subcutaneous tissue: Secondary | ICD-10-CM | POA: Diagnosis not present

## 2021-09-26 DIAGNOSIS — Z85820 Personal history of malignant melanoma of skin: Secondary | ICD-10-CM | POA: Diagnosis not present

## 2021-09-26 DIAGNOSIS — Z85828 Personal history of other malignant neoplasm of skin: Secondary | ICD-10-CM | POA: Diagnosis not present

## 2021-09-26 DIAGNOSIS — L814 Other melanin hyperpigmentation: Secondary | ICD-10-CM | POA: Diagnosis not present

## 2021-12-19 ENCOUNTER — Other Ambulatory Visit: Payer: Self-pay | Admitting: Internal Medicine

## 2021-12-19 DIAGNOSIS — F17211 Nicotine dependence, cigarettes, in remission: Secondary | ICD-10-CM

## 2022-01-18 ENCOUNTER — Ambulatory Visit
Admission: RE | Admit: 2022-01-18 | Discharge: 2022-01-18 | Disposition: A | Discharge status: Home / Self Care | Payer: PPO | Source: Ambulatory Visit | Attending: Internal Medicine | Admitting: Internal Medicine

## 2022-01-18 DIAGNOSIS — F17211 Nicotine dependence, cigarettes, in remission: Secondary | ICD-10-CM

## 2022-01-18 DIAGNOSIS — Z87891 Personal history of nicotine dependence: Secondary | ICD-10-CM | POA: Diagnosis not present

## 2022-01-25 DIAGNOSIS — R197 Diarrhea, unspecified: Secondary | ICD-10-CM | POA: Diagnosis not present

## 2022-01-28 DIAGNOSIS — R197 Diarrhea, unspecified: Secondary | ICD-10-CM | POA: Diagnosis not present

## 2022-02-26 DIAGNOSIS — A0472 Enterocolitis due to Clostridium difficile, not specified as recurrent: Secondary | ICD-10-CM | POA: Diagnosis not present

## 2022-04-03 DIAGNOSIS — A0472 Enterocolitis due to Clostridium difficile, not specified as recurrent: Secondary | ICD-10-CM | POA: Diagnosis not present

## 2022-04-08 DIAGNOSIS — Z85820 Personal history of malignant melanoma of skin: Secondary | ICD-10-CM | POA: Diagnosis not present

## 2022-04-08 DIAGNOSIS — D692 Other nonthrombocytopenic purpura: Secondary | ICD-10-CM | POA: Diagnosis not present

## 2022-04-08 DIAGNOSIS — L821 Other seborrheic keratosis: Secondary | ICD-10-CM | POA: Diagnosis not present

## 2022-04-08 DIAGNOSIS — L814 Other melanin hyperpigmentation: Secondary | ICD-10-CM | POA: Diagnosis not present

## 2022-04-08 DIAGNOSIS — Z85828 Personal history of other malignant neoplasm of skin: Secondary | ICD-10-CM | POA: Diagnosis not present

## 2022-04-08 DIAGNOSIS — D1801 Hemangioma of skin and subcutaneous tissue: Secondary | ICD-10-CM | POA: Diagnosis not present

## 2022-04-08 DIAGNOSIS — L430 Hypertrophic lichen planus: Secondary | ICD-10-CM | POA: Diagnosis not present

## 2022-04-08 DIAGNOSIS — D485 Neoplasm of uncertain behavior of skin: Secondary | ICD-10-CM | POA: Diagnosis not present

## 2022-08-05 DIAGNOSIS — R5383 Other fatigue: Secondary | ICD-10-CM | POA: Diagnosis not present

## 2022-08-05 DIAGNOSIS — Z03818 Encounter for observation for suspected exposure to other biological agents ruled out: Secondary | ICD-10-CM | POA: Diagnosis not present

## 2022-08-05 DIAGNOSIS — R059 Cough, unspecified: Secondary | ICD-10-CM | POA: Diagnosis not present

## 2022-08-05 DIAGNOSIS — R6889 Other general symptoms and signs: Secondary | ICD-10-CM | POA: Diagnosis not present

## 2022-08-14 ENCOUNTER — Ambulatory Visit (INDEPENDENT_AMBULATORY_CARE_PROVIDER_SITE_OTHER): Payer: PPO | Admitting: Family Medicine

## 2022-08-14 ENCOUNTER — Encounter (HOSPITAL_BASED_OUTPATIENT_CLINIC_OR_DEPARTMENT_OTHER): Payer: Self-pay | Admitting: Family Medicine

## 2022-08-14 DIAGNOSIS — Z87891 Personal history of nicotine dependence: Secondary | ICD-10-CM | POA: Insufficient documentation

## 2022-08-14 DIAGNOSIS — R059 Cough, unspecified: Secondary | ICD-10-CM | POA: Diagnosis not present

## 2022-08-14 NOTE — Progress Notes (Signed)
New Patient Office Visit  Subjective    Patient ID: Christine Romero, female    DOB: [birth year]  Age: 77 y.o. MRN: [id number]  CC:  Chief Complaint  Patient presents with   New Patient (Initial Visit)    Pt here to establish new care     HPI Christine Romero presents to establish care Last PCP - Christine Romero with Christine Romero  CT lung cancer screening: Has been completing annual lung cancer screening with low-dose CT scan.  Most recent was in June 2023 with recommendation for repeat imaging in 1 year.  Cough: Reports that she has had intermittent cough over the past few months.  She has used NyQuil intermittently to help with this and this has provided significant relief when she does use it.  She does have a 30-pack-year history of smoking and has been completing annual lung cancer screening as above.  Most recent screening was overall reassuring.  CT scan did indicate evidence of emphysema.  Patient is originally from [place], [place]. She has lived here since 6. She enjoys moving, shopping, gardening.  Outpatient Encounter Medications as of 08/14/2022  Medication Sig   Apoaequorin (PREVAGEN PO) Take 1 tablet by mouth daily.   Ascorbic Acid (VITAMIN C) 1000 MG tablet Take 1,000 mg by mouth daily.   aspirin EC 81 MG tablet Take 81 mg by mouth daily.   calcium carbonate (OS-CAL) 600 MG TABS tablet Take 600 mg by mouth 2 (two) times daily with a meal.   Cholecalciferol (VITAMIN D3) 1000 units CAPS Take 1 capsule by mouth daily.   diphenhydrAMINE (BENADRYL) 25 mg capsule Take 50 mg by mouth at bedtime as needed for sleep.    ibuprofen (ADVIL,MOTRIN) 800 MG tablet Take 1 tablet (800 mg total) by mouth every 8 (eight) hours as needed.   KRILL OIL PO Take 1 tablet by mouth daily.   Multiple Vitamin (MULTIVITAMIN) capsule Take 1 capsule by mouth daily.   Multiple Vitamins-Minerals (MULTIVITAMIN WITH MINERALS) tablet Take 1 tablet by mouth daily.   Probiotic Product (ALIGN PO) Take by mouth.    RESVERATROL PO Take 1 tablet by mouth daily.   thiamine (VITAMIN B-1) 100 MG tablet Take 100 mg by mouth daily.   [DISCONTINUED] acyclovir ointment (ZOVIRAX) 5 % Apply 1 application topically as needed.   [DISCONTINUED] valACYclovir (VALTREX) 1000 MG tablet Take 1 g by mouth as needed. Takes as needed for oral cold sores   [DISCONTINUED] Estradiol (YUVAFEM) 10 MCG TABS vaginal tablet Insert 1 tablet vaginally twice weekly. (Patient not taking: Reported on 06/05/2018)   No facility-administered encounter medications on file as of 08/14/2022.    Past Medical History:  Diagnosis Date   Arthritis    hands   Breast lump    Dyspareunia    Osteoporosis    SVD (spontaneous vaginal delivery)    x 3    Past Surgical History:  Procedure Laterality Date   BREAST SURGERY     breast augmentation   COLONOSCOPY     DILATATION & CURETTAGE/HYSTEROSCOPY WITH MYOSURE N/A 05/31/2014   Procedure: DILATATION & CURETTAGE/ DIAGNOSTIC HYSTEROSCOPY;  Surgeon: Christine Reas de Berton Lan, MD;  Location: [facility];  Service: Gynecology;  Laterality: N/A;   gardners duct surgery  1972   NASAL SEPTUM SURGERY  1980's   POLYPECTOMY     WISDOM TOOTH EXTRACTION      Family History  Problem Relation Age of Onset   Stroke Mother 8  dec   Diabetes Mother    Cancer Father 101       dec colon ca   Colon cancer Father    Other Brother 5       dec-MVA   Multiple sclerosis Sister    Multiple sclerosis Sister    Colitis Sister    Stroke Maternal Aunt    Rectal cancer Neg Hx    Stomach cancer Neg Hx     Social History   Socioeconomic History   Marital status: Married    Spouse name: Not on file   Number of children: Not on file   Years of education: Not on file   Highest education level: Not on file  Occupational History   Not on file  Tobacco Use   Smoking status: Former    Packs/day: 0.25    Types: Cigarettes   Smokeless tobacco: Never   Tobacco comments:    quit 2014  Vaping Use    Vaping Use: Never used  Substance and Sexual Activity   Alcohol use: No    Alcohol/week: 0.0 standard drinks of alcohol    Comment: socially   Drug use: No   Sexual activity: Not Currently    Partners: Male    Birth control/protection: Post-menopausal    Comment: postmenopausal  Other Topics Concern   Not on file  Social History Narrative   Not on file   Social Determinants of Health   Financial Resource Strain: Not on file  Food Insecurity: Not on file  Transportation Needs: Not on file  Physical Activity: Not on file  Stress: Not on file  Social Connections: Not on file  Intimate Partner Violence: Not on file    Objective    BP 124/76 (BP Location: Right Arm, Patient Position: Sitting, Cuff Size: Large)   Pulse 79   Ht 5' 4.5" (1.638 m)   Wt 123 lb 8 oz (56 kg)   LMP 07/30/1983   SpO2 100%   BMI 20.87 kg/m   Physical Exam  77 year old female in no acute distress Cardiovascular exam regular rate and rhythm Lungs clear to auscultation bilaterally  Assessment & Plan:   Problem List Items Addressed This Visit       Other   Cough    Uncertain etiology, discussed considerations, potential referral to neurology here given new onset of symptoms, history of smoking.  For now, she would prefer to hold off on referral, but she will let us know if she would like to proceed with this      History of tobacco use    Patient with history of tobacco use, 30-pack-year history of smoking.  She has been completing regular CT lung cancer screening.  She will next be due for this in about 5 months       Return if symptoms worsen or fail to improve, for AWV.  We will request records from previous PCP office to be able to review recent office notes, any prior testing and labs  Christine Eich J De [place], MD

## 2022-08-14 NOTE — Assessment & Plan Note (Signed)
Patient with history of tobacco use, 30-pack-year history of smoking.  She has been completing regular CT lung cancer screening.  She will next be due for this in about 5 months

## 2022-08-14 NOTE — Assessment & Plan Note (Signed)
Uncertain etiology, discussed considerations, potential referral to neurology here given new onset of symptoms, history of smoking.  For now, she would prefer to hold off on referral, but she will let us know if she would like to proceed with this

## 2022-09-02 ENCOUNTER — Ambulatory Visit (HOSPITAL_BASED_OUTPATIENT_CLINIC_OR_DEPARTMENT_OTHER): Payer: PPO | Admitting: Family Medicine

## 2022-10-07 ENCOUNTER — Telehealth (HOSPITAL_BASED_OUTPATIENT_CLINIC_OR_DEPARTMENT_OTHER): Payer: Self-pay | Admitting: Family Medicine

## 2022-10-07 NOTE — Telephone Encounter (Signed)
I left a message on patient's voice mail to confirm her appointments on 10/08/2022 at 9:30-AWV and 10:10 with Dr.de [place].

## 2022-10-08 ENCOUNTER — Ambulatory Visit (INDEPENDENT_AMBULATORY_CARE_PROVIDER_SITE_OTHER): Payer: PPO

## 2022-10-08 ENCOUNTER — Encounter (HOSPITAL_BASED_OUTPATIENT_CLINIC_OR_DEPARTMENT_OTHER): Payer: Self-pay | Admitting: Family Medicine

## 2022-10-08 ENCOUNTER — Encounter (HOSPITAL_BASED_OUTPATIENT_CLINIC_OR_DEPARTMENT_OTHER): Payer: Self-pay

## 2022-10-08 ENCOUNTER — Ambulatory Visit (INDEPENDENT_AMBULATORY_CARE_PROVIDER_SITE_OTHER): Payer: PPO | Admitting: Family Medicine

## 2022-10-08 VITALS — BP 133/82 | HR 76 | Temp 96.7°F | Ht 64.5 in | Wt 125.2 lb

## 2022-10-08 DIAGNOSIS — Z7689 Persons encountering health services in other specified circumstances: Secondary | ICD-10-CM

## 2022-10-08 DIAGNOSIS — N3946 Mixed incontinence: Secondary | ICD-10-CM | POA: Insufficient documentation

## 2022-10-08 DIAGNOSIS — Z0000 Encounter for general adult medical examination without abnormal findings: Secondary | ICD-10-CM

## 2022-10-08 DIAGNOSIS — Z87891 Personal history of nicotine dependence: Secondary | ICD-10-CM | POA: Diagnosis not present

## 2022-10-08 DIAGNOSIS — R3589 Other polyuria: Secondary | ICD-10-CM

## 2022-10-08 MED ORDER — PANTOPRAZOLE SODIUM 40 MG PO TBEC: 40.0000 mg | DELAYED_RELEASE_TABLET | Freq: Every day | ORAL | 1 refills | Status: AC

## 2022-10-08 NOTE — Progress Notes (Signed)
Subjective:    CC: Annual Physical Exam  HPI:  Christine Romero is a 77 y.o. presenting for annual physical  I reviewed the past medical history, family history, social history, surgical history, and allergies today and no changes were needed.  Please see the problem list section below in epic for further details.  Past Medical History: Past Medical History:  Diagnosis Date   Arthritis    hands   Breast lump    Dyspareunia    Osteoporosis    SVD (spontaneous vaginal delivery)    x 3   Past Surgical History: Past Surgical History:  Procedure Laterality Date   BREAST SURGERY     breast augmentation   COLONOSCOPY     DILATATION & CURETTAGE/HYSTEROSCOPY WITH MYOSURE N/A 05/31/2014   Procedure: DILATATION & CURETTAGE/ DIAGNOSTIC HYSTEROSCOPY;  Surgeon: Jamey Reas de Berton Lan, MD;  Location: [facility];  Service: Gynecology;  Laterality: N/A;   gardners duct surgery  1972   NASAL SEPTUM SURGERY  1980's   POLYPECTOMY     WISDOM TOOTH EXTRACTION     Social History: Social History   Socioeconomic History   Marital status: Married    Spouse name: Not on file   Number of children: Not on file   Years of education: Not on file   Highest education level: Not on file  Occupational History   Not on file  Tobacco Use   Smoking status: Former    Packs/day: 0.25    Types: Cigarettes   Smokeless tobacco: Never   Tobacco comments:    quit 2014  Vaping Use   Vaping Use: Never used  Substance and Sexual Activity   Alcohol use: No    Alcohol/week: 0.0 standard drinks of alcohol    Comment: socially   Drug use: No   Sexual activity: Not Currently    Partners: Male    Birth control/protection: Post-menopausal    Comment: postmenopausal  Other Topics Concern   Not on file  Social History Narrative   Not on file   Social Determinants of Health   Financial Resource Strain: Low Risk  (10/08/2022)   Overall Financial Resource Strain (CARDIA)    Difficulty of Paying  Living Expenses: Not hard at all  Food Insecurity: No Food Insecurity (10/08/2022)   Hunger Vital Sign    Worried About Running Out of Food in the Last Year: Never true    Ran Out of Food in the Last Year: Never true  Transportation Needs: No Transportation Needs (10/08/2022)   PRAPARE - Hydrologist (Medical): No    Lack of Transportation (Non-Medical): No  Physical Activity: Inactive (10/08/2022)   Exercise Vital Sign    Days of Exercise per Week: 0 days    Minutes of Exercise per Session: 0 min  Stress: No Stress Concern Present (10/08/2022)   [place]    Feeling of Stress : Not at all  Social Connections: [place] (10/08/2022)   Social Connection and Isolation Panel [NHANES]    Frequency of Communication with Friends and Family: More than three times a week    Frequency of Social Gatherings with Friends and Family: More than three times a week    Attends Religious Services: More than 4 times per year    Active Member of [employer] or Organizations: Yes    Attends Archivist Meetings: More than 4 times per year    Marital  Status: Married   Family History: Family History  Problem Relation Age of Onset   Stroke Mother 69       dec   Diabetes Mother    Cancer Father 21       dec colon ca   Colon cancer Father    Other Brother 61       dec-MVA   Multiple sclerosis Sister    Multiple sclerosis Sister    Colitis Sister    Stroke Maternal Aunt    Rectal cancer Neg Hx    Stomach cancer Neg Hx    Allergies: Allergies  Allergen Reactions   Talwin [Pentazocine] Other (See Comments)    hallucinations   Medications: See med rec.  Review of Systems: No headache, visual changes, nausea, vomiting, diarrhea, constipation, dizziness, abdominal pain, skin rash, fevers, chills, night sweats, swollen lymph nodes, weight loss, chest pain, body aches, joint swelling, muscle aches,  shortness of breath, mood changes, visual or auditory hallucinations.  Objective:    BP 128/79 (BP Location: Left Arm, Patient Position: Sitting, Cuff Size: Normal)   Pulse 78   Temp 97.6 F (36.4 C) (Oral)   Ht 5' 4.5" (1.638 m)   Wt 125 lb (56.7 kg)   LMP 07/30/1983   SpO2 100%   BMI 21.12 kg/m   General: Well Developed, well nourished, and in no acute distress. Neuro: Alert and oriented x3, extra-ocular muscles intact, sensation grossly intact. Cranial nerves II through XII are intact, motor, sensory, and coordinative functions are all intact. HEENT: Normocephalic, atraumatic, pupils equal round reactive to light, neck supple, no masses, no lymphadenopathy, thyroid nonpalpable. Oropharynx, nasopharynx, external ear canals are unremarkable. Skin: Warm and dry, no rashes noted. Cardiac: Regular rate and rhythm, no murmurs rubs or gallops. Respiratory: Clear to auscultation bilaterally. Not using accessory muscles, speaking in full sentences. Abdominal: Soft, nontender, nondistended, positive bowel sounds, no masses, no organomegaly. Musculoskeletal: Shoulder, elbow, wrist, hip, knee, ankle stable, and with full range of motion.  Impression and Recommendations:    Wellness examination Routine HCM labs ordered. HCM reviewed/discussed. Anticipatory guidance regarding healthy weight, lifestyle and choices given. Recommend healthy diet.  Recommend approximately 150 minutes/week of moderate intensity exercise Recommend regular dental and vision exams Always use seatbelt/lap and shoulder restraints Recommend using smoke alarms and checking batteries at least twice a year Recommend using sunscreen when outside Discussed colon cancer screening recommendations Discussed immunization recommendations  Mixed incontinence Patient additionally reports intermittent issues with incontinence.  She reports that she will have urine leakage with coughing, laughing, sneezing.  She additionally will  have times when she will have sudden urge to urinate and it can be difficult for her to make it to a bathroom.  These issues have been present for some time.  She denies any dysuria, no blood in the urine.  She has not had any abdominal pain. Discussed options today, we will proceed with initial testing with urine dip in office.  If abnormal findings observed, we will proceed accordingly.  If urine testing is unremarkable, can proceed with trial of mirabegron.  Additionally discussed role of pelvic floor PT/exercises.  History of tobacco use Patient will be due for CT lung cancer screening in June 2024.  We will plan to order this at that time.  Return in about 3 months (around 01/08/2023).   ___________________________________________ Rashaunda Rahl de [place], MD, ABFM, CAQSM [facility] [place]

## 2022-10-08 NOTE — Assessment & Plan Note (Signed)
Patient additionally reports intermittent issues with incontinence.  She reports that she will have urine leakage with coughing, laughing, sneezing.  She additionally will have times when she will have sudden urge to urinate and it can be difficult for her to make it to a bathroom.  These issues have been present for some time.  She denies any dysuria, no blood in the urine.  She has not had any abdominal pain. Discussed options today, we will proceed with initial testing with urine dip in office.  If abnormal findings observed, we will proceed accordingly.  If urine testing is unremarkable, can proceed with trial of mirabegron.  Additionally discussed role of pelvic floor PT/exercises.

## 2022-10-08 NOTE — Patient Instructions (Addendum)
[name] , Thank you for taking time to come for your Medicare Wellness Visit. I appreciate your ongoing commitment to your health goals. Please review the following plan we discussed and let me know if I can assist you in the future.   These are the goals we discussed:  Goals       Stay healthy and alive (pt-stated)        This is a list of the screening recommended for you and due dates:  Health Maintenance  Topic Date Due   COVID-19 Vaccine (1) Never done   DTaP/Tdap/Td vaccine (1 - Tdap) Never done   Zoster (Shingles) Vaccine (1 of 2) 01/08/2023*   Pneumonia Vaccine (1 of 1 - PCV) 10/08/2023*   Colon Cancer Screening  10/08/2023*   Hepatitis C Screening: USPSTF Recommendation to screen - Ages 18-79 yo.  10/08/2023*   Flu Shot  02/27/2023   Medicare Annual Wellness Visit  10/08/2023   DEXA scan (bone density measurement)  Completed   HPV Vaccine  Aged Out  *Topic was postponed. The date shown is not the original due date.    Advanced directives: In Chart  Conditions/risks identified: None  Next appointment: Follow up in one year for your annual wellness visit    Preventive Care 65 Years and Older, Female Preventive care refers to lifestyle choices and visits with your health care provider that can promote health and wellness. What does preventive care include? A yearly physical exam. This is also called an annual well check. Dental exams once or twice a year. Routine eye exams. Ask your health care provider how often you should have your eyes checked. Personal lifestyle choices, including: Daily care of your teeth and gums. Regular physical activity. Eating a healthy diet. Avoiding tobacco and drug use. Limiting alcohol use. Practicing safe sex. Taking low-dose aspirin every day. Taking vitamin and mineral supplements as recommended by your health care provider. What happens during an annual well check? The services and screenings done by your health care  provider during your annual well check will depend on your age, overall health, lifestyle risk factors, and family history of disease. Counseling  Your health care provider may ask you questions about your: Alcohol use. Tobacco use. Drug use. Emotional well-being. Home and relationship well-being. Sexual activity. Eating habits. History of falls. Memory and ability to understand (cognition). Work and work Astronomer. Reproductive health. Screening  You may have the following tests or measurements: Height, weight, and BMI. Blood pressure. Lipid and cholesterol levels. These may be checked every 5 years, or more frequently if you are over 79 years old. Skin check. Lung cancer screening. You may have this screening every year starting at age 79 if you have a 30-pack-year history of smoking and currently smoke or have quit within the past 15 years. Fecal occult blood test (FOBT) of the stool. You may have this test every year starting at age 79. Flexible sigmoidoscopy or colonoscopy. You may have a sigmoidoscopy every 5 years or a colonoscopy every 10 years starting at age 79. Hepatitis C blood test. Hepatitis B blood test. Sexually transmitted disease (STD) testing. Diabetes screening. This is done by checking your blood sugar (glucose) after you have not eaten for a while (fasting). You may have this done every 1-3 years. Bone density scan. This is done to screen for osteoporosis. You may have this done starting at age 79. Mammogram. This may be done every 1-2 years. Talk to your health care provider about how  often you should have regular mammograms. Talk with your health care provider about your test results, treatment options, and if necessary, the need for more tests. Vaccines  Your health care provider may recommend certain vaccines, such as: Influenza vaccine. This is recommended every year. Tetanus, diphtheria, and acellular pertussis (Tdap, Td) vaccine. You may need a Td  booster every 10 years. Zoster vaccine. You may need this after age 79. Pneumococcal 13-valent conjugate (PCV13) vaccine. One dose is recommended after age 79. Pneumococcal polysaccharide (PPSV23) vaccine. One dose is recommended after age 79. Talk to your health care provider about which screenings and vaccines you need and how often you need them. This information is not intended to replace advice given to you by your health care provider. Make sure you discuss any questions you have with your health care provider. Document Released: 08/11/2015 Document Revised: 04/03/2016 Document Reviewed: 05/16/2015 Elsevier Interactive Patient Education  2017 [place] Prevention in the Home Falls can cause injuries. They can happen to people of all ages. There are many things you can do to make your home safe and to help prevent falls. What can I do on the outside of my home? Regularly fix the edges of walkways and driveways and fix any cracks. Remove anything that might make you trip as you walk through a door, such as a raised step or threshold. Trim any bushes or trees on the path to your home. Use bright outdoor lighting. Clear any walking paths of anything that might make someone trip, such as rocks or tools. Regularly check to see if handrails are loose or broken. Make sure that both sides of any steps have handrails. Any raised decks and porches should have guardrails on the edges. Have any leaves, snow, or ice cleared regularly. Use sand or salt on walking paths during winter. Clean up any spills in your garage right away. This includes oil or grease spills. What can I do in the bathroom? Use night lights. Install grab bars by the toilet and in the tub and shower. Do not use towel bars as grab bars. Use non-skid mats or decals in the tub or shower. If you need to sit down in the shower, use a plastic, non-slip stool. Keep the floor dry. Clean up any water that spills on the floor  as soon as it happens. Remove soap buildup in the tub or shower regularly. Attach bath mats securely with double-sided non-slip rug tape. Do not have throw rugs and other things on the floor that can make you trip. What can I do in the bedroom? Use night lights. Make sure that you have a light by your bed that is easy to reach. Do not use any sheets or blankets that are too big for your bed. They should not hang down onto the floor. Have a firm chair that has side arms. You can use this for support while you get dressed. Do not have throw rugs and other things on the floor that can make you trip. What can I do in the kitchen? Clean up any spills right away. Avoid walking on wet floors. Keep items that you use a lot in easy-to-reach places. If you need to reach something above you, use a strong step stool that has a grab bar. Keep electrical cords out of the way. Do not use floor polish or wax that makes floors slippery. If you must use wax, use non-skid floor wax. Do not have throw rugs and other things  on the floor that can make you trip. What can I do with my stairs? Do not leave any items on the stairs. Make sure that there are handrails on both sides of the stairs and use them. Fix handrails that are broken or loose. Make sure that handrails are as long as the stairways. Check any carpeting to make sure that it is firmly attached to the stairs. Fix any carpet that is loose or worn. Avoid having throw rugs at the top or bottom of the stairs. If you do have throw rugs, attach them to the floor with carpet tape. Make sure that you have a light switch at the top of the stairs and the bottom of the stairs. If you do not have them, ask someone to add them for you. What else can I do to help prevent falls? Wear shoes that: Do not have high heels. Have rubber bottoms. Are comfortable and fit you well. Are closed at the toe. Do not wear sandals. If you use a stepladder: Make sure that it is  fully opened. Do not climb a closed stepladder. Make sure that both sides of the stepladder are locked into place. Ask someone to hold it for you, if possible. Clearly mark and make sure that you can see: Any grab bars or handrails. First and last steps. Where the edge of each step is. Use tools that help you move around (mobility aids) if they are needed. These include: Canes. Walkers. Scooters. Crutches. Turn on the lights when you go into a dark area. Replace any light bulbs as soon as they burn out. Set up your furniture so you have a clear path. Avoid moving your furniture around. If any of your floors are uneven, fix them. If there are any pets around you, be aware of where they are. Review your medicines with your doctor. Some medicines can make you feel dizzy. This can increase your chance of falling. Ask your doctor what other things that you can do to help prevent falls. This information is not intended to replace advice given to you by your health care provider. Make sure you discuss any questions you have with your health care provider. Document Released: 05/11/2009 Document Revised: 12/21/2015 Document Reviewed: 08/19/2014 Elsevier Interactive Patient Education  2017 [employer].

## 2022-10-08 NOTE — Addendum Note (Signed)
Addended by: DE [place], Damere Brandenburg J on: 10/08/2022 01:24 PM   Modules accepted: Orders, Level of Service

## 2022-10-08 NOTE — Assessment & Plan Note (Signed)
Patient will be due for CT lung cancer screening in June 2024.  We will plan to order this at that time.

## 2022-10-08 NOTE — Assessment & Plan Note (Signed)
Routine HCM labs ordered. HCM reviewed/discussed. Anticipatory guidance regarding healthy weight, lifestyle and choices given. Recommend healthy diet.  Recommend approximately 150 minutes/week of moderate intensity exercise Recommend regular dental and vision exams Always use seatbelt/lap and shoulder restraints Recommend using smoke alarms and checking batteries at least twice a year Recommend using sunscreen when outside Discussed colon cancer screening recommendations Discussed immunization recommendations

## 2022-10-08 NOTE — Progress Notes (Addendum)
Subjective:   Christine Romero is a 77 y.o. female who presents for Medicare Annual (Subsequent) preventive examination.  Review of Systems      Cardiac Risk Factors include: advanced age (>96men, >39 women)     Objective:    Today's Vitals   10/08/22 0928  BP: 133/82  Pulse: 76  Temp: (!) 96.7 F (35.9 C)  TempSrc: Oral  SpO2: 100%  Weight: 125 lb 3.2 oz (56.8 kg)  Height: 5' 4.5" (1.638 m)   Body mass index is 21.16 kg/m.     10/08/2022    9:51 AM 05/25/2014    9:47 AM 05/12/2014   10:41 AM  Advanced Directives  Does Patient Have a Medical Advance Directive? Yes Yes Yes  Type of Estate agent of [place];Living will Healthcare Power of [place];Living will Healthcare Power of Attorney  Does patient want to make changes to medical advance directive? No - Patient declined  No - Patient declined  Copy of Healthcare Power of Attorney in Chart? Yes - validated most recent copy scanned in chart (See row information)  No - copy requested    Current Medications (verified) Outpatient Encounter Medications as of 10/08/2022  Medication Sig   Apoaequorin (PREVAGEN PO) Take 1 tablet by mouth daily.   Ascorbic Acid (VITAMIN C) 1000 MG tablet Take 1,000 mg by mouth daily.   aspirin EC 81 MG tablet Take 81 mg by mouth daily.   calcium carbonate (OS-CAL) 600 MG TABS tablet Take 600 mg by mouth 2 (two) times daily with a meal.   Cholecalciferol (VITAMIN D3) 1000 units CAPS Take 1 capsule by mouth daily.   diphenhydrAMINE (BENADRYL) 25 mg capsule Take 50 mg by mouth at bedtime as needed for sleep.    ibuprofen (ADVIL,MOTRIN) 800 MG tablet Take 1 tablet (800 mg total) by mouth every 8 (eight) hours as needed.   KRILL OIL PO Take 1 tablet by mouth daily.   Multiple Vitamin (MULTIVITAMIN) capsule Take 1 capsule by mouth daily.   Multiple Vitamins-Minerals (MULTIVITAMIN WITH MINERALS) tablet Take 1 tablet by mouth daily.   Probiotic Product (ALIGN PO) Take by mouth.    RESVERATROL PO Take 1 tablet by mouth daily.   thiamine (VITAMIN B-1) 100 MG tablet Take 100 mg by mouth daily.   No facility-administered encounter medications on file as of 10/08/2022.    Allergies (verified) Talwin [pentazocine]   History: Past Medical History:  Diagnosis Date   Arthritis    hands   Breast lump    Dyspareunia    Osteoporosis    SVD (spontaneous vaginal delivery)    x 3   Past Surgical History:  Procedure Laterality Date   BREAST SURGERY     breast augmentation   COLONOSCOPY     DILATATION & CURETTAGE/HYSTEROSCOPY WITH MYOSURE N/A 05/31/2014   Procedure: DILATATION & CURETTAGE/ DIAGNOSTIC HYSTEROSCOPY;  Surgeon: Jacqualin Combes de Gwenevere Ghazi, MD;  Location: [facility];  Service: Gynecology;  Laterality: N/A;   gardners duct surgery  1972   NASAL SEPTUM SURGERY  1980's   POLYPECTOMY     WISDOM TOOTH EXTRACTION     Family History  Problem Relation Age of Onset   Stroke Mother 35       dec   Diabetes Mother    Cancer Father 62       dec colon ca   Colon cancer Father    Other Brother 69       dec-MVA   Multiple  sclerosis Sister    Multiple sclerosis Sister    Colitis Sister    Stroke Maternal Aunt    Rectal cancer Neg Hx    Stomach cancer Neg Hx    Social History   Socioeconomic History   Marital status: Married    Spouse name: Not on file   Number of children: Not on file   Years of education: Not on file   Highest education level: Not on file  Occupational History   Not on file  Tobacco Use   Smoking status: Former    Packs/day: .25    Types: Cigarettes   Smokeless tobacco: Never   Tobacco comments:    quit 2014  Vaping Use   Vaping Use: Never used  Substance and Sexual Activity   Alcohol use: No    Alcohol/week: 0.0 standard drinks of alcohol    Comment: socially   Drug use: No   Sexual activity: Not Currently    Partners: Male    Birth control/protection: Post-menopausal    Comment: postmenopausal  Other Topics  Concern   Not on file  Social History Narrative   Not on file   Social Determinants of Health   Financial Resource Strain: Low Risk  (10/08/2022)   Overall Financial Resource Strain (CARDIA)    Difficulty of Paying Living Expenses: Not hard at all  Food Insecurity: No Food Insecurity (10/08/2022)   Hunger Vital Sign    Worried About Running Out of Food in the Last Year: Never true    Ran Out of Food in the Last Year: Never true  Transportation Needs: No Transportation Needs (10/08/2022)   PRAPARE - Administrator, Civil Service (Medical): No    Lack of Transportation (Non-Medical): No  Physical Activity: Inactive (10/08/2022)   Exercise Vital Sign    Days of Exercise per Week: 0 days    Minutes of Exercise per Session: 0 min  Stress: No Stress Concern Present (10/08/2022)   [employer] of Occupational Health - Occupational Stress Questionnaire    Feeling of Stress : Not at all  Social Connections: Socially Integrated (10/08/2022)   Social Connection and Isolation Panel [NHANES]    Frequency of Communication with Friends and Family: More than three times a week    Frequency of Social Gatherings with Friends and Family: More than three times a week    Attends Religious Services: More than 4 times per year    Active Member of [employer] or Organizations: Yes    Attends Engineer, structural: More than 4 times per year    Marital Status: Married    Tobacco Counseling Counseling given: Not Answered Tobacco comments: quit 2014   Clinical Intake:  Pre-visit preparation completed: No  Pain : No/denies pain     BMI - recorded: 21.16 Nutritional Status: BMI of 19-24  Normal Nutritional Risks: None Diabetes: No  How often do you need to have someone help you when you read instructions, pamphlets, or other written materials from your doctor or pharmacy?: 1 - Never  Diabetic?  No  Interpreter Needed?: No  Information entered by :: Theresa Mulligan  LPN   Activities of Daily Living    10/08/2022    9:48 AM 08/14/2022    1:59 PM  In your present state of health, do you have any difficulty performing the following activities:  Hearing? 0 0  Vision? 0 0  Difficulty concentrating or making decisions? 0 0  Walking or climbing stairs? 0 0  Dressing or bathing? 0 0  Doing errands, shopping? 0 0  Preparing Food and eating ? N   Using the Toilet? N   In the past six months, have you accidently leaked urine? Y   Comment Wears pads. Pending medical attention   Do you have problems with loss of bowel control? N   Managing your Medications? N   Managing your Finances? N   Housekeeping or managing your Housekeeping? N     Patient Care Team: de [place], Blondell Reveal, MD as PCP - General (Family Medicine)  Indicate any recent Medical Services you may have received from other than [facility] providers in the past year (date may be approximate).     Assessment:   This is a routine wellness examination for Jazara.  Hearing/Vision screen Hearing Screening - Comments:: Denies hearing difficulties   Vision Screening - Comments:: Wears rx glasses - up to date with routine eye exams with  Dr Joya San  Dietary issues and exercise activities discussed: Exercise limited by: None identified   Goals Addressed               This Visit's Progress     Stay healthy and alive (pt-stated)         Depression Screen    10/08/2022    9:27 AM 08/14/2022    1:59 PM  PHQ 2/9 Scores  PHQ - 2 Score 0 0  PHQ- 9 Score 0 0  Exception Documentation  Medical reason    Fall Risk    10/08/2022    9:50 AM 08/14/2022    1:58 PM  [place] in the past year? 0 0  Number falls in past yr: 0 0  Injury with Fall? 0 0  Risk for fall due to : No Fall Risks No Fall Risks  Follow up Falls prevention discussed Falls evaluation completed    [place]:  Any stairs in or around the home? No  If so, are there any without  handrails? No  Home free of loose throw rugs in walkways, pet beds, electrical cords, etc? Yes  Adequate lighting in your home to reduce risk of falls? Yes   ASSISTIVE DEVICES UTILIZED TO PREVENT FALLS:  Life alert? No  Use of a cane, walker or w/c? No  Grab bars in the bathroom? No  Shower chair or bench in shower? Yes  Elevated toilet seat or a handicapped toilet? No   TIMED UP AND GO:  Was the test performed? Yes .  Length of time to ambulate 10 feet: 10 sec.   Gait steady and fast without use of assistive device  Cognitive Function:        10/08/2022    9:51 AM  6CIT Screen  What Year? 0 points  What month? 0 points  What time? 0 points  Count back from 20 0 points  Months in reverse 0 points  Repeat phrase 0 points  Total Score 0 points    Immunizations  There is no immunization history on file for this patient.  TDAP status: Due, Education has been provided regarding the importance of this vaccine. Advised may receive this vaccine at local pharmacy or Health Dept. Aware to provide a copy of the vaccination record if obtained from local pharmacy or Health Dept. Verbalized acceptance and understanding.  Flu Vaccine status: Up to date  Pneumococcal vaccine status: Due, Education has been provided regarding the importance of this vaccine. Advised may receive  this vaccine at local pharmacy or Health Dept. Aware to provide a copy of the vaccination record if obtained from local pharmacy or Health Dept. Verbalized acceptance and understanding.  Covid-19 vaccine status: Declined, Education has been provided regarding the importance of this vaccine but patient still declined. Advised may receive this vaccine at local pharmacy or Health Dept.or vaccine clinic. Aware to provide a copy of the vaccination record if obtained from local pharmacy or Health Dept. Verbalized acceptance and understanding.  Qualifies for Shingles Vaccine? Yes   Zostavax completed Yes   Shingrix  Completed?: Yes  Screening Tests Health Maintenance  Topic Date Due   COVID-19 Vaccine (1) Never done   DTaP/Tdap/Td (1 - Tdap) Never done   Zoster Vaccines- Shingrix (1 of 2) 01/08/2023 (Originally 10/15/1993)   Pneumonia Vaccine 38+ Years old (1 of 1 - PCV) 10/08/2023 (Originally 10/15/2008)   COLONOSCOPY (Pts 45-69yrs Insurance coverage will need to be confirmed)  10/08/2023 (Originally 06/09/2019)   Hepatitis C Screening  10/08/2023 (Originally 10/15/1963)   INFLUENZA VACCINE  02/27/2023   Medicare Annual Wellness (AWV)  10/08/2023   DEXA SCAN  Completed   HPV VACCINES  Aged Out    Health Maintenance  Health Maintenance Due  Topic Date Due   COVID-19 Vaccine (1) Never done   DTaP/Tdap/Td (1 - Tdap) Never done    Colonoscopy: Deferred  Mammogram status: No longer required due to Age.  Bone Density status: Completed 07/18/16. Results reflect: Bone density results: OSTEOPENIA. Repeat every   years.  Lung Cancer Screening: (Low Dose CT Chest recommended if Age 91-80 years, 30 pack-year currently smoking OR have quit w/in 15years.) does not qualify.     Additional Screening:  Hepatitis C Screening: does qualify; Completed Deferred  Vision Screening: Recommended annual ophthalmology exams for early detection of glaucoma and other disorders of the eye. Is the patient up to date with their annual eye exam?  Yes  Who is the provider or what is the name of the office in which the patient attends annual eye exams? Dr Marti Sleigh If pt is not established with a provider, would they like to be referred to a provider to establish care? No .   Dental Screening: Recommended annual dental exams for proper oral hygiene  Community Resource Referral / Chronic Care Management:  CRR required this visit?  No   CCM required this visit?  No      Plan:     I have personally reviewed and noted the following in the patient's chart:   Medical and social history Use of alcohol, tobacco or  illicit drugs  Current medications and supplements including opioid prescriptions. Patient is not currently taking opioid prescriptions. Functional ability and status Nutritional status Physical activity Advanced directives List of other physicians Hospitalizations, surgeries, and ER visits in previous 12 months Vitals Screenings to include cognitive, depression, and falls Referrals and appointments  In addition, I have reviewed and discussed with patient certain preventive protocols, quality metrics, and best practice recommendations. A written personalized care plan for preventive services as well as general preventive health recommendations were provided to patient.     Tillie Rung, LPN   [id number]   Nurse Notes: Patient due Hep-C Screening

## 2022-10-09 LAB — POCT URINALYSIS DIPSTICK
Bilirubin, UA: NEGATIVE
Blood, UA: NEGATIVE
Glucose, UA: NEGATIVE
Ketones, UA: NEGATIVE
Leukocytes, UA: NEGATIVE
Nitrite, UA: NEGATIVE
Protein, UA: NEGATIVE
Spec Grav, UA: 1.01 (ref 1.010–1.025)
Urobilinogen, UA: NEGATIVE E.U./dL — AB
pH, UA: 6 (ref 5.0–8.0)

## 2022-10-09 LAB — CBC WITH DIFFERENTIAL/PLATELET
Basophils Absolute: 0 10*3/uL (ref 0.0–0.2)
Basos: 1 %
EOS (ABSOLUTE): 0.1 10*3/uL (ref 0.0–0.4)
Eos: 1 %
Hematocrit: 41 % (ref 34.0–46.6)
Hemoglobin: 13.7 g/dL (ref 11.1–15.9)
Immature Grans (Abs): 0 10*3/uL (ref 0.0–0.1)
Immature Granulocytes: 0 %
Lymphocytes Absolute: 1.7 10*3/uL (ref 0.7–3.1)
Lymphs: 33 %
MCH: 29.4 pg (ref 26.6–33.0)
MCHC: 33.4 g/dL (ref 31.5–35.7)
MCV: 88 fL (ref 79–97)
Monocytes Absolute: 0.5 10*3/uL (ref 0.1–0.9)
Monocytes: 9 %
Neutrophils Absolute: 2.8 10*3/uL (ref 1.4–7.0)
Neutrophils: 56 %
Platelets: 239 10*3/uL (ref 150–450)
RBC: 4.66 x10E6/uL (ref 3.77–5.28)
RDW: 12.3 % (ref 11.7–15.4)
WBC: 5.1 10*3/uL (ref 3.4–10.8)

## 2022-10-09 LAB — LIPID PANEL
Chol/HDL Ratio: 2.3 ratio (ref 0.0–4.4)
Cholesterol, Total: 211 mg/dL — ABNORMAL HIGH (ref 100–199)
HDL: 92 mg/dL (ref >39)
LDL Chol Calc (NIH): 106 mg/dL — ABNORMAL HIGH (ref 0–99)
Triglycerides: 71 mg/dL (ref 0–149)
VLDL Cholesterol Cal: 13 mg/dL (ref 5–40)

## 2022-10-09 LAB — COMPREHENSIVE METABOLIC PANEL
ALT: 14 IU/L (ref 0–32)
AST: 19 IU/L (ref 0–40)
Albumin/Globulin Ratio: 2 (ref 1.2–2.2)
Albumin: 4.4 g/dL (ref 3.8–4.8)
Alkaline Phosphatase: 91 IU/L (ref 44–121)
BUN/Creatinine Ratio: 14 (ref 12–28)
BUN: 14 mg/dL (ref 8–27)
Bilirubin Total: 0.4 mg/dL (ref 0.0–1.2)
CO2: 24 mmol/L (ref 20–29)
Calcium: 9.7 mg/dL (ref 8.7–10.3)
Chloride: 103 mmol/L (ref 96–106)
Creatinine, Ser: 0.98 mg/dL (ref 0.57–1.00)
Globulin, Total: 2.2 g/dL (ref 1.5–4.5)
Glucose: 98 mg/dL (ref 70–99)
Potassium: 4.2 mmol/L (ref 3.5–5.2)
Sodium: 142 mmol/L (ref 134–144)
Total Protein: 6.6 g/dL (ref 6.0–8.5)
eGFR: 59 mL/min/{1.73_m2} — ABNORMAL LOW (ref >59)

## 2022-10-09 LAB — HEMOGLOBIN A1C
Est. average glucose Bld gHb Est-mCnc: 117 mg/dL
Hgb A1c MFr Bld: 5.7 % — ABNORMAL HIGH (ref 4.8–5.6)

## 2022-10-09 LAB — TSH RFX ON ABNORMAL TO FREE T4: TSH: 2.15 u[IU]/mL (ref 0.450–4.500)

## 2022-10-09 NOTE — Addendum Note (Signed)
Addended by: Lowella Bandy on: 10/09/2022 01:53 PM   Modules accepted: Orders

## 2022-10-30 NOTE — Addendum Note (Signed)
Addended by: Adalberto Cole E on: 10/30/2022 11:41 AM   Modules accepted: Level of Service

## 2022-11-07 NOTE — Addendum Note (Signed)
Addended by: Tillie Rung on: 11/07/2022 04:03 PM   Modules accepted: Orders

## 2022-11-12 ENCOUNTER — Telehealth (HOSPITAL_BASED_OUTPATIENT_CLINIC_OR_DEPARTMENT_OTHER): Payer: Self-pay | Admitting: Family Medicine

## 2022-11-12 NOTE — Telephone Encounter (Signed)
Pt is calling to get a referral for a Colonoscopy & GYN. Pt stated she talked to [facility] last week, and had not heard anything. I advised her that Foye Clock, NP could do her GYN needs, however the patient would not elaborate on why she needed a GYN.   Dr Hyacinth Meeker is not taking any new patients and the patient had seen [facility] in the past

## 2022-12-18 ENCOUNTER — Other Ambulatory Visit (HOSPITAL_BASED_OUTPATIENT_CLINIC_OR_DEPARTMENT_OTHER): Payer: Self-pay | Admitting: Family Medicine

## 2022-12-18 DIAGNOSIS — Z1211 Encounter for screening for malignant neoplasm of colon: Secondary | ICD-10-CM

## 2022-12-18 DIAGNOSIS — Z7689 Persons encountering health services in other specified circumstances: Secondary | ICD-10-CM

## 2022-12-18 DIAGNOSIS — N3946 Mixed incontinence: Secondary | ICD-10-CM

## 2022-12-18 MED ORDER — MIRABEGRON ER 25 MG PO TB24: 25.0000 mg | ORAL_TABLET | Freq: Every day | ORAL | 1 refills | Status: DC

## 2023-01-09 ENCOUNTER — Encounter (HOSPITAL_BASED_OUTPATIENT_CLINIC_OR_DEPARTMENT_OTHER): Payer: Self-pay | Admitting: Family Medicine

## 2023-01-09 ENCOUNTER — Ambulatory Visit (INDEPENDENT_AMBULATORY_CARE_PROVIDER_SITE_OTHER): Payer: PPO | Admitting: Family Medicine

## 2023-01-09 DIAGNOSIS — G629 Polyneuropathy, unspecified: Secondary | ICD-10-CM | POA: Insufficient documentation

## 2023-01-09 DIAGNOSIS — N3946 Mixed incontinence: Secondary | ICD-10-CM | POA: Diagnosis not present

## 2023-01-09 DIAGNOSIS — Z87891 Personal history of nicotine dependence: Secondary | ICD-10-CM

## 2023-01-09 MED ORDER — MIRABEGRON ER 25 MG PO TB24: 25.0000 mg | ORAL_TABLET | Freq: Every day | ORAL | 1 refills | Status: DC

## 2023-01-09 NOTE — Assessment & Plan Note (Signed)
Has completed CT chest for lung cancer screening in the past, prior imaging was in June 2023 with recommendation for 1 year follow-up.  Patient denies any new symptoms or concerns.  We will proceed with repeat CT lung cancer screening, order placed today

## 2023-01-09 NOTE — Assessment & Plan Note (Signed)
Notes that over the past month she has had more noticeable sensory changes, primarily on the bottom of her feet.  She indicates that she has had this to a degree for some time, however more noticeable over the past month.  Denies any weakness, balance or gait concerns.  Related to this, she is requesting referral to neurology. As per the patient preference, we can proceed with neurology referral.  They likely will review need for additional testing including any potential labs or procedural evaluation.

## 2023-01-09 NOTE — Progress Notes (Signed)
    Procedures performed today:    None.  Independent interpretation of notes and tests performed by another provider:   None.  Brief History, Exam, Impression, and Recommendations:    BP 127/79 (BP Location: Left Arm, Patient Position: Sitting, Cuff Size: Normal)   Pulse 69   Ht 5' 4.5" (1.638 m)   Wt 125 lb 6.4 oz (56.9 kg)   LMP 07/30/1983   SpO2 99% Comment: RA  BMI 21.19 kg/m   History of tobacco use Assessment & Plan: Has completed CT chest for lung cancer screening in the past, prior imaging was in June 2023 with recommendation for 1 year follow-up.  Patient denies any new symptoms or concerns.  We will proceed with repeat CT lung cancer screening, order placed today  Orders: -     CT CHEST LUNG CANCER SCREENING LOW DOSE WO CONTRAST; Future  Mixed incontinence Assessment & Plan: Prior urine testing was generally unremarkable, patient continues to have symptoms.  Prescription was sent to pharmacy, however patient never received notification of medication being sent and never did start medication.  We discussed options today, she still continues with symptoms, indicates that symptoms are not overly impactful for her.  Can proceed with trial of oral medication and assess response.  We did discuss possibility of referral to urologist or urogynecologist.  She is unsure if she would want to proceed with referral as she is not sure if symptoms are impactful enough.  We can continue monitoring moving forward  Orders: -     Mirabegron ER; Take 1 tablet (25 mg total) by mouth daily.  Dispense: 30 tablet; Refill: 1  Neuropathy Assessment & Plan: Notes that over the past month she has had more noticeable sensory changes, primarily on the bottom of her feet.  She indicates that she has had this to a degree for some time, however more noticeable over the past month.  Denies any weakness, balance or gait concerns.  Related to this, she is requesting referral to neurology. As per the  patient preference, we can proceed with neurology referral.  They likely will review need for additional testing including any potential labs or procedural evaluation.  Orders: -     Ambulatory referral to Neurology  Return in about 4 months (around 05/11/2023) for med check, CT scan review, referral follow-up.   ___________________________________________ Christine Bieker de [place], MD, ABFM, CAQSM Primary Care and Sports Medicine [facility]

## 2023-01-09 NOTE — Assessment & Plan Note (Signed)
Prior urine testing was generally unremarkable, patient continues to have symptoms.  Prescription was sent to pharmacy, however patient never received notification of medication being sent and never did start medication.  We discussed options today, she still continues with symptoms, indicates that symptoms are not overly impactful for her.  Can proceed with trial of oral medication and assess response.  We did discuss possibility of referral to urologist or urogynecologist.  She is unsure if she would want to proceed with referral as she is not sure if symptoms are impactful enough.  We can continue monitoring moving forward

## 2023-01-09 NOTE — Patient Instructions (Signed)
  Medication Instructions:  Your physician recommends that you continue on your current medications as directed. Please refer to the Current Medication list given to you today. --If you need a refill on any your medications before your next appointment, please call your pharmacy first. If no refills are authorized on file call the office.-- Lab Work: Your physician has recommended that you have lab work today: No If you have labs (blood work) drawn today and your tests are completely normal, you will receive your results via MyChart message OR a phone call from our staff.  Please ensure you check your voicemail in the event that you authorized detailed messages to be left on a delegated number. If you have any lab test that is abnormal or we need to change your treatment, we will call you to review the results.  Referrals/Procedures/Imaging: No  Follow-Up: Your next appointment:   Your physician recommends that you schedule a follow-up appointment in: 4 months with Dr. de [place].  You will receive a text message or e-mail with a link to a survey about your care and experience with us today! We would greatly appreciate your feedback!   Thanks for letting us be apart of your health journey!!  Primary Care and Sports Medicine   Dr. Raymond de Cuba   We encourage you to activate your patient portal called "MyChart".  Sign up information is provided on this After Visit Summary.  MyChart is used to connect with patients for Virtual Visits (Telemedicine).  Patients are able to view lab/test results, encounter notes, upcoming appointments, etc.  Non-urgent messages can be sent to your provider as well. To learn more about what you can do with MyChart, please visit --  [email or web address].    

## 2023-01-10 ENCOUNTER — Encounter: Payer: Self-pay | Admitting: Neurology

## 2023-01-21 LAB — AMB RESULTS CONSOLE CBG: Glucose: 136

## 2023-01-23 ENCOUNTER — Ambulatory Visit
Admission: RE | Admit: 2023-01-23 | Discharge: 2023-01-23 | Disposition: A | Discharge status: Home / Self Care | Payer: PPO | Source: Ambulatory Visit | Attending: Family Medicine | Admitting: Family Medicine

## 2023-01-23 DIAGNOSIS — Z87891 Personal history of nicotine dependence: Secondary | ICD-10-CM

## 2023-01-27 NOTE — Progress Notes (Signed)
No new SDOH needs since 10/08/22

## 2023-02-05 NOTE — Progress Notes (Signed)
**Note Christine-Identified via Obfuscation** Initial neurology clinic note  Reason for Evaluation: Consultation requested by Christine Romero, Christine Kos, MD for an opinion regarding sensory changes in feet. My final recommendations will be communicated back to the requesting physician by way of shared medical record or letter to requesting physician via [place] mail.  HPI: This is [name], a 77 y.o. right-handed female with a medical history of HLD, OA who presents to neurology clinic with the chief complaint of sensory changes in feet. The patient is alone today.  Patient has had symptoms for a couple of years. She first noticed symptoms while in bed, thinking she still had her socks on, but she didn't. She had some numbness and tingling in her feet. Over the years, symptoms do feel more pronounced. She still thinks her symptoms are only in her feet or maybe into lower legs. She denies weakness in her legs, imbalance, or falls. She gets occasional cramps in her legs at night, but has been better lately as she is drinking more water. She denies symptoms in her arms.  The patient denies symptoms suggestive of oculobulbar weakness including diplopia, ptosis, poor saliva control, dysarthria/dysphonia, impaired mastication, facial weakness/droop. She does mention that occasionally feeling like she has difficulty swallowing. She has had a swallowing test that was normal, per her report. She was treated for reflux with some improvement.  There are no neuromuscular respiratory weakness symptoms, particularly orthopnea>dyspnea.   The patient does not report symptoms referable to autonomic dysfunction including impaired sweating, heat or cold intolerance, excessive mucosal dryness, postprandial abdominal bloating, constipation, bowel or bladder dyscontrol, or syncope/presyncope/orthostatic intolerance. She does endorse some abdominal bloating.  She report any constitutional symptoms like fever, night sweats, anorexia or unintentional weight  loss.  Patient is on a lot of supplements including vitamin D, B12 because she wants to be healthy (not previously deficient).  EtOH use: rare, socially (once every couple of month)  Restrictive diet? Does not eat as much as previous Family history of neuropathy/myopathy/neurologic disease? No  She has never had an EMG.   MEDICATIONS:  Outpatient Encounter Medications as of 02/19/2023  Medication Sig   Apoaequorin (PREVAGEN PO) Take 1 tablet by mouth daily.   Ascorbic Acid (VITAMIN C) 1000 MG tablet Take 1,000 mg by mouth daily.   aspirin EC 81 MG tablet Take 81 mg by mouth daily.   calcium carbonate (OS-CAL) 600 MG TABS tablet Take 600 mg by mouth 2 (two) times daily with a meal.   Cholecalciferol (VITAMIN D3) 1000 units CAPS Take 1 capsule by mouth daily.   doxylamine, Sleep, (UNISOM) 25 MG tablet Take 25 mg by mouth at bedtime as needed for sleep.   ibuprofen (ADVIL,MOTRIN) 800 MG tablet Take 1 tablet (800 mg total) by mouth every 8 (eight) hours as needed.   KRILL OIL PO Take 1 tablet by mouth daily.   mirabegron ER (MYRBETRIQ) 25 MG TB24 tablet Take 1 tablet (25 mg total) by mouth daily.   Multiple Vitamin (MULTIVITAMIN) capsule Take 1 capsule by mouth daily.   Multiple Vitamins-Minerals (MULTIVITAMIN WITH MINERALS) tablet Take 1 tablet by mouth daily.   pantoprazole (PROTONIX) 40 MG tablet Take 1 tablet (40 mg total) by mouth daily. (Patient taking differently: Take 40 mg by mouth as needed.)   Probiotic Product (ALIGN PO) Take by mouth.   RESVERATROL PO Take 1 tablet by mouth daily.   thiamine (VITAMIN B-1) 100 MG tablet Take 100 mg by mouth daily.   No facility-administered encounter medications  on file as of 02/19/2023.    PAST MEDICAL HISTORY: Past Medical History:  Diagnosis Date   Arthritis    hands   Breast lump    Dyspareunia    Osteoporosis    SVD (spontaneous vaginal delivery)    x 3    PAST SURGICAL HISTORY: Past Surgical History:  Procedure Laterality  Date   BREAST SURGERY     breast augmentation   COLONOSCOPY     DILATATION & CURETTAGE/HYSTEROSCOPY WITH MYOSURE N/A 05/31/2014   Procedure: DILATATION & CURETTAGE/ DIAGNOSTIC HYSTEROSCOPY;  Surgeon: Jacqualin Combes Christine Gwenevere Ghazi, MD;  Location: [facility];  Service: Gynecology;  Laterality: N/A;   gardners duct surgery  1972   NASAL SEPTUM SURGERY  1980's   POLYPECTOMY     WISDOM TOOTH EXTRACTION      ALLERGIES: Allergies  Allergen Reactions   Talwin [Pentazocine] Other (See Comments)    hallucinations    FAMILY HISTORY: Family History  Problem Relation Age of Onset   Stroke Mother 19       dec   Diabetes Mother    Cancer Father 65       dec colon ca   Colon cancer Father    Other Brother 48       dec-MVA   Multiple sclerosis Sister    Multiple sclerosis Sister    Colitis Sister    Stroke Maternal Aunt    Rectal cancer Neg Hx    Stomach cancer Neg Hx     SOCIAL HISTORY: Social History   Tobacco Use   Smoking status: Former    Current packs/day: 0.00    Average packs/day: 1 pack/day for 50.0 years (50.0 ttl pk-yrs)    Types: Cigarettes    Start date: 1964    Quit date: 2014    Years since quitting: 10.5   Smokeless tobacco: Never  Vaping Use   Vaping status: Never Used  Substance Use Topics   Alcohol use: No    Alcohol/week: 0.0 standard drinks of alcohol    Comment: socially   Drug use: No   Social History   Social History Narrative   Not on file     OBJECTIVE: PHYSICAL EXAM: BP 119/76   Pulse 84   Ht 5\' 5"  (1.651 m)   Wt 127 lb (57.6 kg)   LMP 07/30/1983   SpO2 97%   BMI 21.13 kg/m   General: General appearance: Awake and alert. No distress. Cooperative with exam.  Skin: No obvious rash or jaundice. HEENT: Atraumatic. Anicteric. Lungs: Non-labored breathing on room air  Extremities: No edema. Arthritic changes in bilateral hands. Psych: Affect appropriate.  Neurological: Mental Status: Alert. Speech fluent. No pseudobulbar  affect Cranial Nerves: CNII: No RAPD. Visual fields grossly intact. CNIII, IV, VI: PERRL. No nystagmus. EOMI. CN V: Facial sensation intact bilaterally to fine touch. [place]eter clench strong. Jaw jerk negative. CN VII: Facial muscles symmetric and strong. No ptosis at rest. CN VIII: Hearing grossly intact bilaterally. CN IX: No hypophonia. CN X: Palate elevates symmetrically. CN XI: Full strength shoulder shrug bilaterally. CN XII: Tongue protrusion full and midline. No atrophy or fasciculations. No significant dysarthria Motor: Tone is normal. No fasciculations in extremities. No atrophy. Strength is 5/5 in bilateral upper and lower extremities. Reflexes:  Right Left   Bicep 2-3+ 2-3+   Tricep 2-3+ 2-3+   BrRad 2-3+ 2-3+   Knee 2-3+ 2-3+   Ankle 0 0    Pathological Reflexes: Babinski: flexor response bilaterally  Hoffman: present bilaterally Troemner: absent bilaterally Pectoral: absent bilaterally Facial: absent bilaterally Midline tap: absent Sensation: Pinprick: Intact in all extremities Vibration: Diminished in bilateral great toes and ankles, otherwise intact Proprioception: Intact in bilateral great toes. Coordination: Intact finger-to- nose-finger bilaterally. Romberg negative. Gait: Able to rise from chair with arms crossed unassisted. Normal, narrow-based gait.  Lab and Test Review: Internal labs: 10/08/22: HbA1c: 5.7 TSH: 2.150 CMP unremarkable CBC w/ diff unremarkable Lipid panel: Component     Latest Ref Rng 10/08/2022  Cholesterol, Total     100 - 199 mg/dL 161 (H)   Triglycerides     0 - 149 mg/dL 71   HDL Cholesterol     >39 mg/dL 92   VLDL Cholesterol Cal     5 - 40 mg/dL 13   LDL Chol Calc (NIH)     0 - 99 mg/dL 096 (H)   Total CHOL/HDL Ratio     0.0 - 4.4 ratio 2.3     ASSESSMENT: Christine Romero is a 77 y.o. female who presents for evaluation of numbness and tingling in bilateral feet. She has a relevant medical history of HLD, OA . Her  neurological examination is pertinent for diminished vibratory sensation in bilateral feet and ankles. Available diagnostic data is significant for HbA1c of 5.7. Patient's symptoms are most consistent with a very mild distal symmetric polyneuropathy. She has very mild pre-diabetes, but otherwise, no known risk factors. I will send labs to look for treatable causes.  PLAN: -Blood work: B1, B6, B12, IFE -Discussed medications for neuropathic pain, but patient does not find current symptoms bothersome enough to warrant treatment -Recommend alpha lipoic acid 600 mg once or twice daily -Lidocaine cream PRN  -Return to clinic in 6 months  The impression above as well as the plan as outlined below were extensively discussed with the patient who voiced understanding. All questions were answered to their satisfaction.  When available, results of the above investigations and possible further recommendations will be communicated to the patient via telephone/MyChart. Patient to call office if not contacted after expected testing turnaround time.   Total time spent reviewing records, interview, history/exam, documentation, and coordination of care on day of encounter:  60 min   Thank you for allowing me to participate in patient's care.  If I can answer any additional questions, I would be pleased to do so.  Jacquelyne Balint, MD   CC: Christine Romero, Christine Kos, MD [address] [place] [address]  CC: Referring provider: de Romero, Christine Kos, MD [address] [place],  [place] [address]

## 2023-02-19 ENCOUNTER — Encounter: Payer: Self-pay | Admitting: Neurology

## 2023-02-19 ENCOUNTER — Ambulatory Visit: Payer: PPO | Admitting: Neurology

## 2023-02-19 ENCOUNTER — Other Ambulatory Visit (INDEPENDENT_AMBULATORY_CARE_PROVIDER_SITE_OTHER): Payer: PPO

## 2023-02-19 VITALS — BP 119/76 | HR 84 | Ht 65.0 in | Wt 127.0 lb

## 2023-02-19 DIAGNOSIS — R209 Unspecified disturbances of skin sensation: Secondary | ICD-10-CM

## 2023-02-19 DIAGNOSIS — R202 Paresthesia of skin: Secondary | ICD-10-CM

## 2023-02-19 DIAGNOSIS — R200 Anesthesia of skin: Secondary | ICD-10-CM

## 2023-02-19 DIAGNOSIS — R7303 Prediabetes: Secondary | ICD-10-CM

## 2023-02-19 LAB — VITAMIN B12: Vitamin B-12: 568 pg/mL (ref 211–911)

## 2023-02-19 NOTE — Patient Instructions (Signed)
I saw you today for numbness and tingling in the feet. I think you may have mild nerve damage in your feet called neuropathy. I would like to investigate for causes with blood work today. I will be in touch when I have the results.  Alpha lipoic acid 600mg  daily has some research data suggesting it helps with nerve health. No major side effects other than <1% of people report upset stomach. This can be taken twice per day (1200mg  daily) if no relief obtained. You can buy this over the counter or online.  You can also try Lidocaine cream as needed. Apply wear you have pain, tingling, or burning. Wear gloves to prevent your hands being numb. This can be bought over the counter at any drug store or online.  I would like to see you back in clinic in 6 months. Please let me know if you have any questions or concerns in the meantime.   The physicians and staff at [facility] are committed to providing excellent care. You may receive a survey requesting feedback about your experience at our office. We strive to receive "very good" responses to the survey questions. If you feel that your experience would prevent you from giving the office a "very good " response, please contact our office to try to remedy the situation. We may be reached at [phone]. Thank you for taking the time out of your busy day to complete the survey.  Jacquelyne Balint, MD [facility]

## 2023-02-27 ENCOUNTER — Encounter: Payer: Self-pay | Admitting: Gastroenterology

## 2023-04-10 LAB — HM COLONOSCOPY

## 2023-04-18 LAB — HM DEXA SCAN

## 2023-04-21 ENCOUNTER — Encounter (HOSPITAL_BASED_OUTPATIENT_CLINIC_OR_DEPARTMENT_OTHER): Payer: Self-pay | Admitting: *Deleted

## 2023-04-22 ENCOUNTER — Encounter: Payer: Self-pay | Admitting: Neurology

## 2023-05-06 ENCOUNTER — Encounter: Payer: Self-pay | Admitting: Family Medicine

## 2023-05-12 ENCOUNTER — Ambulatory Visit (HOSPITAL_BASED_OUTPATIENT_CLINIC_OR_DEPARTMENT_OTHER): Payer: PPO | Admitting: Family Medicine

## 2023-05-16 ENCOUNTER — Encounter (HOSPITAL_BASED_OUTPATIENT_CLINIC_OR_DEPARTMENT_OTHER): Payer: Self-pay | Admitting: Family Medicine

## 2023-05-16 ENCOUNTER — Ambulatory Visit (INDEPENDENT_AMBULATORY_CARE_PROVIDER_SITE_OTHER): Payer: PPO | Admitting: Family Medicine

## 2023-05-16 VITALS — BP 118/78 | HR 81 | Ht 64.0 in | Wt 124.0 lb

## 2023-05-16 DIAGNOSIS — N3946 Mixed incontinence: Secondary | ICD-10-CM | POA: Diagnosis not present

## 2023-05-16 DIAGNOSIS — Z87891 Personal history of nicotine dependence: Secondary | ICD-10-CM | POA: Diagnosis not present

## 2023-05-16 DIAGNOSIS — Z23 Encounter for immunization: Secondary | ICD-10-CM | POA: Diagnosis not present

## 2023-05-16 DIAGNOSIS — Z0000 Encounter for general adult medical examination without abnormal findings: Secondary | ICD-10-CM

## 2023-05-16 MED ORDER — MIRABEGRON ER 25 MG PO TB24
25.0000 mg | ORAL_TABLET | Freq: Every day | ORAL | 1 refills | Status: AC
Start: 2023-05-16 — End: ?

## 2023-05-16 NOTE — Assessment & Plan Note (Signed)
Patient still with symptoms present, however she has been able to utilize mirabegron and does feel that this is led to some improvement.  Generally, symptoms are manageable at this time.  We again reviewed considerations related to evaluation with specialist.  Given adequate control of symptoms at this time, patient would prefer to hold off on this which is reasonable.  Can continue with Myrbetriq, refill sent to pharmacy on file.

## 2023-05-16 NOTE — Progress Notes (Signed)
    Procedures performed today:    None.  Independent interpretation of notes and tests performed by another provider:   None.  Brief History, Exam, Impression, and Recommendations:    BP 118/78 (BP Location: Right Arm, Patient Position: Sitting, Cuff Size: Normal)   Pulse 81   Ht 5\' 4"  (1.626 m)   Wt 124 lb (56.2 kg)   LMP 07/30/1983   SpO2 98%   BMI 21.28 kg/m   Encounter for immunization -     Flu Vaccine Trivalent High Dose (Fluad)  Mixed incontinence Assessment & Plan: Patient still with symptoms present, however she has been able to utilize mirabegron and does feel that this is led to some improvement.  Generally, symptoms are manageable at this time.  We again reviewed considerations related to evaluation with specialist.  Given adequate control of symptoms at this time, patient would prefer to hold off on this which is reasonable.  Can continue with Myrbetriq, refill sent to pharmacy on file.  Orders: -     Mirabegron ER; Take 1 tablet (25 mg total) by mouth daily.  Dispense: 90 tablet; Refill: 1  History of tobacco use Assessment & Plan: Patient did have CT scanning of lungs completed for screening and this was generally reassuring.  CT scan with lung RADS 2, recommendation to repeat CT scan for screening in 12 months.  Incidental findings of aortic atherosclerosis and emphysema noted by radiologist.   Wellness examination -     CBC with Differential/Platelet; Future -     Comprehensive metabolic panel; Future -     Hemoglobin A1c; Future -     Lipid panel; Future -     TSH Rfx on Abnormal to Free T4; Future  Patient was able to establish with neurologist related to neuropathy.  Has had labs completed.  Continues to have symptoms, feels that symptoms have been gradually progressive.  Recommend continuing with follow-up with neurologist to assess for any further recommendations in regards to additional evaluation, testing or treatment.  Hyperlipidemia noted on  prior labs, evidence of aortic atherosclerosis on recent imaging.  Will need to further review interventions related to cholesterol management including lifestyle modifications, consideration of statin therapy.  Return in about 5 months (around 10/14/2023) for CPE with fasting labs 1 week prior.   ___________________________________________ Saliha Salts de [place], MD, ABFM, CAQSM Primary Care and Sports Medicine [facility]

## 2023-05-16 NOTE — Assessment & Plan Note (Signed)
Patient did have CT scanning of lungs completed for screening and this was generally reassuring.  CT scan with lung RADS 2, recommendation to repeat CT scan for screening in 12 months.  Incidental findings of aortic atherosclerosis and emphysema noted by radiologist.

## 2023-06-04 ENCOUNTER — Ambulatory Visit: Payer: PPO | Admitting: Gastroenterology

## 2023-06-10 ENCOUNTER — Ambulatory Visit: Payer: PPO | Admitting: Internal Medicine

## 2023-07-25 ENCOUNTER — Encounter (HOSPITAL_BASED_OUTPATIENT_CLINIC_OR_DEPARTMENT_OTHER): Payer: Self-pay | Admitting: Family Medicine

## 2023-07-25 ENCOUNTER — Other Ambulatory Visit (HOSPITAL_BASED_OUTPATIENT_CLINIC_OR_DEPARTMENT_OTHER): Payer: Self-pay

## 2023-07-25 ENCOUNTER — Ambulatory Visit (HOSPITAL_BASED_OUTPATIENT_CLINIC_OR_DEPARTMENT_OTHER): Payer: PPO | Admitting: Family Medicine

## 2023-07-25 VITALS — BP 120/79 | HR 84 | Temp 98.4°F | Ht 64.0 in | Wt 125.5 lb

## 2023-07-25 DIAGNOSIS — J209 Acute bronchitis, unspecified: Secondary | ICD-10-CM | POA: Diagnosis not present

## 2023-07-25 LAB — POCT INFLUENZA A/B
Influenza A, POC: NEGATIVE
Influenza B, POC: NEGATIVE

## 2023-07-25 LAB — POC COVID19 BINAXNOW: SARS Coronavirus 2 Ag: NEGATIVE

## 2023-07-25 MED ORDER — PREDNISONE 10 MG (21) PO TBPK
ORAL_TABLET | ORAL | 0 refills | Status: DC
Start: 2023-07-25 — End: 2023-07-29
  Filled 2023-07-25: qty 21, 6d supply, fill #0

## 2023-07-25 MED ORDER — ALBUTEROL SULFATE HFA 108 (90 BASE) MCG/ACT IN AERS
2.0000 | INHALATION_SPRAY | Freq: Four times a day (QID) | RESPIRATORY_TRACT | 2 refills | Status: AC | PRN
  Filled 2023-07-25: qty 8.5, 25d supply, fill #0

## 2023-07-25 MED ORDER — AZITHROMYCIN 250 MG PO TABS
ORAL_TABLET | ORAL | 0 refills | Status: DC
  Filled 2023-07-25: qty 6, 5d supply, fill #0

## 2023-07-25 NOTE — Patient Instructions (Addendum)
Please use plain Mucinex (Guaifenesin) for chest congestion.  Increase clear fluids.  Use Albuterol inhaler for cough and wheezing every 4-6 hours as needed.   Use Delsym or Robitussin for cough.   If no improvement in 3-4 days, reach out to PCP.

## 2023-07-25 NOTE — Progress Notes (Signed)
Acute Care Office Visit  Subjective:   Christine Romero 1946-06-02 07/25/2023  Chief Complaint  Patient presents with   Cough    Patient states 10 days ago, she had a runny nose and a sore throat. States her throat got better but still has a runny nose and also has had a cough and also has had a headache associated.    HPI: URI SYMPTOMS: Onset: 10 days ago.   Patient reports new onset of headache, cough, and sore throat. States symptoms started 10 days ago with sore throat and runny nose. She states chest congestion continues to worsen. Former smoker. Reports hx of bronchitits once.   Fever: No Decreased Appetite: No  Fatigue: Mild Runny nose: Yes Nasal congestion: yes  Sinus pressure: yes Post nasal drip: Yes Cough: Yes, congested, non productive Ear pain: No Sore throat: Has resolved Nausea/Vomiting: Denies Diarrhea: Denies   Treatments tried: Mucinex- took 1 tablet last night with mild improvement in congestion   Recent sick contacts: None   The following portions of the patient's history were reviewed and updated as appropriate: past medical history, past surgical history, family history, social history, allergies, medications, and problem list.   Patient Active Problem List   Diagnosis Date Noted   Neuropathy 01/09/2023   Mixed incontinence 10/08/2022   Wellness examination 10/08/2022   Cough 08/14/2022   History of tobacco use 08/14/2022   Past Medical History:  Diagnosis Date   Arthritis    hands   Breast lump    Dyspareunia    Osteoporosis    SVD (spontaneous vaginal delivery)    x 3   Past Surgical History:  Procedure Laterality Date   BREAST SURGERY     breast augmentation   COLONOSCOPY     DILATATION & CURETTAGE/HYSTEROSCOPY WITH MYOSURE N/A 05/31/2014   Procedure: DILATATION & CURETTAGE/ DIAGNOSTIC HYSTEROSCOPY;  Surgeon: Forrestine Him Amundson de Gwenevere Ghazi, MD;  Location: [facility];  Service: Gynecology;  Laterality: N/A;   [facility]  duct surgery  1972   NASAL SEPTUM SURGERY  1980's   POLYPECTOMY     WISDOM TOOTH EXTRACTION     Family History  Problem Relation Age of Onset   Stroke Mother 89       dec   Diabetes Mother    Cancer Father 55       dec colon ca   Colon cancer Father    Other Brother 78       dec-MVA   Multiple sclerosis Sister    Multiple sclerosis Sister    Colitis Sister    Stroke Maternal Aunt    Rectal cancer Neg Hx    Stomach cancer Neg Hx    Outpatient Medications Prior to Visit  Medication Sig Dispense Refill   Apoaequorin (PREVAGEN PO) Take 1 tablet by mouth daily.     Ascorbic Acid (VITAMIN C) 1000 MG tablet Take 1,000 mg by mouth daily.     aspirin EC 81 MG tablet Take 81 mg by mouth daily.     calcium carbonate (OS-CAL) 600 MG TABS tablet Take 600 mg by mouth 2 (two) times daily with a meal.     Cholecalciferol (VITAMIN D3) 1000 units CAPS Take 1 capsule by mouth daily.     cyanocobalamin (VITAMIN B12) 1000 MCG tablet Take 1,000 mcg by mouth daily.     doxylamine, Sleep, (UNISOM) 25 MG tablet Take 25 mg by mouth at bedtime as needed for sleep.  ibuprofen (ADVIL,MOTRIN) 800 MG tablet Take 1 tablet (800 mg total) by mouth every 8 (eight) hours as needed. 30 tablet 0   KRILL OIL PO Take 1 tablet by mouth daily.     mirabegron ER (MYRBETRIQ) 25 MG TB24 tablet Take 1 tablet (25 mg total) by mouth daily. 90 tablet 1   Multiple Vitamin (MULTIVITAMIN) capsule Take 1 capsule by mouth daily.     Multiple Vitamins-Minerals (MULTIVITAMIN WITH MINERALS) tablet Take 1 tablet by mouth daily.     pantoprazole (PROTONIX) 40 MG tablet Take 1 tablet (40 mg total) by mouth daily. (Patient taking differently: Take 40 mg by mouth as needed.) 90 tablet 1   Probiotic Product (ALIGN PO) Take by mouth.     RESVERATROL PO Take 1 tablet by mouth daily.     thiamine (VITAMIN B-1) 100 MG tablet Take 100 mg by mouth daily.     No facility-administered medications prior to visit.   Allergies  Allergen  Reactions   Talwin [Pentazocine] Other (See Comments)    hallucinations     ROS: A complete ROS was performed with pertinent positives/negatives noted in the HPI. The remainder of the ROS are negative.    Objective:   Today's Vitals   07/25/23 0847  BP: 120/79  Pulse: 84  Temp: 98.4 F (36.9 C)  TempSrc: Oral  SpO2: 97%  Weight: 125 lb 8 oz (56.9 kg)  Height: 5\' 4"  (1.626 m)    GENERAL: Fatigued,  in NAD. Well nourished.  SKIN: Pink, warm and dry. No rash, lesion, ulceration, or ecchymoses.  Head: Normocephalic. NECK: Trachea midline. Full ROM w/o pain or tenderness. No lymphadenopathy.  EARS: Tympanic membranes are intact, translucent without bulging and without drainage. Appropriate landmarks visualized.  EYES: Conjunctiva clear without exudates. EOMI, PERRL, no drainage present.  NOSE: Septum midline w/o deformity. Nares patent, mucosa pink and mildly inflamed w/ clear drainage. No sinus tenderness.  THROAT: Uvula midline. Oropharynx clear. Mucous membranes pink and moist.  RESPIRATORY: Chest wall symmetrical. Respirations even and non-labored. Breath sounds clear to auscultation bilaterally. Cough is congested, productive and hacking.  CARDIAC: S1, S2 present, regular rate and rhythm without murmur or gallops. Peripheral pulses 2+ bilaterally.  MSK: Muscle tone and strength appropriate for age.  EXTREMITIES: Without clubbing, cyanosis, or edema.  NEUROLOGIC: No motor or sensory deficits. Steady, even gait. C2-C12 intact.  PSYCH/MENTAL STATUS: Alert, oriented x 3. Cooperative, appropriate mood and affect.    Results for orders placed or performed in visit on 07/25/23  POCT Influenza A/B  Result Value Ref Range   Influenza A, POC Negative Negative   Influenza B, POC Negative Negative  POC COVID-19 BinaxNow  Result Value Ref Range   SARS Coronavirus 2 Ag Negative Negative      Assessment & Plan:  1. Acute bronchitis, unspecified organism (Primary) Acute bronchititis  likely due to COPD. Neg for COVID and Flu in office. Start zpack, prednisone taper, plain Mucinex, and albuterol inhaler. If no improvement or worsening in 48 hours, reach out to PCP and obtain chest xray. Recommend OTC Delsym, Robitussin for cough syrup as needed.   - POCT Influenza A/B - POC COVID-19 BinaxNow - azithromycin (ZITHROMAX) 250 MG tablet; Take 2 tablets on day 1, then 1 tablet daily on days 2 through 5  Dispense: 6 tablet; Refill: 0 - predniSONE (STERAPRED UNI-PAK 21 TAB) 10 MG (21) TBPK tablet; Use as directed.  Dispense: 21 each; Refill: 0 - albuterol (VENTOLIN HFA) 108 (90 Base) MCG/ACT inhaler;  Inhale 2 puffs into the lungs every 6 (six) hours as needed for wheezing or shortness of breath.  Dispense: 8 g; Refill: 2   Meds ordered this encounter  Medications   azithromycin (ZITHROMAX) 250 MG tablet    Sig: Take 2 tablets on day 1, then 1 tablet daily on days 2 through 5    Dispense:  6 tablet    Refill:  0    Supervising Provider:   DE [place], RAYMOND J [[id number]]   predniSONE (STERAPRED UNI-PAK 21 TAB) 10 MG (21) TBPK tablet    Sig: Use as directed.    Dispense:  21 each    Refill:  0    Supervising Provider:   DE [place], RAYMOND J [[id number]]   albuterol (VENTOLIN HFA) 108 (90 Base) MCG/ACT inhaler    Sig: Inhale 2 puffs into the lungs every 6 (six) hours as needed for wheezing or shortness of breath.    Dispense:  8.5 g    Refill:  2    Supervising Provider:   DE [place], RAYMOND J [[id number]]   Lab Orders         POCT Influenza A/B         POC COVID-19 BinaxNow     Return if symptoms worsen or fail to improve.    Patient to reach out to office if new, worrisome, or unresolved symptoms arise or if no improvement in patient's condition. Patient verbalized understanding and is agreeable to treatment plan. All questions answered to patient's satisfaction.    Hilbert Bible, [place]

## 2023-07-29 ENCOUNTER — Ambulatory Visit (INDEPENDENT_AMBULATORY_CARE_PROVIDER_SITE_OTHER): Payer: PPO

## 2023-07-29 ENCOUNTER — Other Ambulatory Visit (HOSPITAL_BASED_OUTPATIENT_CLINIC_OR_DEPARTMENT_OTHER): Payer: Self-pay

## 2023-07-29 ENCOUNTER — Encounter (HOSPITAL_BASED_OUTPATIENT_CLINIC_OR_DEPARTMENT_OTHER): Payer: Self-pay | Admitting: Family Medicine

## 2023-07-29 ENCOUNTER — Ambulatory Visit (INDEPENDENT_AMBULATORY_CARE_PROVIDER_SITE_OTHER): Payer: PPO | Admitting: Family Medicine

## 2023-07-29 VITALS — BP 147/78 | HR 74 | Temp 98.1°F | Ht 64.0 in | Wt 127.9 lb

## 2023-07-29 DIAGNOSIS — J209 Acute bronchitis, unspecified: Secondary | ICD-10-CM

## 2023-07-29 MED ORDER — IPRATROPIUM-ALBUTEROL 0.5-2.5 (3) MG/3ML IN SOLN
3.0000 mL | Freq: Once | RESPIRATORY_TRACT | Status: AC
Start: 2023-07-29 — End: 2023-07-29
  Administered 2023-07-29: 3 mL via RESPIRATORY_TRACT

## 2023-07-29 MED ORDER — IPRATROPIUM-ALBUTEROL 0.5-2.5 (3) MG/3ML IN SOLN
3.0000 mL | Freq: Four times a day (QID) | RESPIRATORY_TRACT | 1 refills | Status: AC | PRN
  Filled 2023-07-29: qty 360, 30d supply, fill #0

## 2023-07-29 NOTE — Progress Notes (Signed)
Hi Christine Romero,  Your chest xray is negative for pneumonia. Please use the mucinex and breathing treatments as recommended. If no improvement in the next 1-2 weeks, please let me know.

## 2023-07-29 NOTE — Patient Instructions (Addendum)
Please go to the pharmacy to pick up a nebulizer machine.   Please use the breathing treatments every 4-6 hours or at least twice daily to help with cough and congestion. Continue your mucinex as well.

## 2023-07-29 NOTE — Progress Notes (Signed)
 Acute Care Office Visit  Subjective:   Christine Romero [birth year] 07/29/2023  Chief Complaint  Patient presents with   Cough    Patient states she is still coughing which has gotten better but still has a lot of congestion.    HPI: URI SYMPTOMS: Patient diagnosed with acute bronchitis due to COPD on 07/25/23 and started on Prednisone  taper, zpack, albuterol  inhaler and recommended plain Mucinex, Delsym to help with cough. She is finishing the Zpack and Prednisone  taper today. She is taking Mucinex BID and Delsym and using the albuterol  once per day. She reports improvement in [facility], cough, and fatigue but reports ongoing congestion that she feels she cannot clear. She denies fever/chills, decreased appetite or fatigue. She is concerned as she is unable to expel a lot of congestion and feels as if airways are affecting clearance.    The following portions of the patient's history were reviewed and updated as appropriate: past medical history, past surgical history, family history, social history, allergies, medications, and problem list.   Patient Active Problem List   Diagnosis Date Noted   Neuropathy 01/09/2023   Mixed incontinence 10/08/2022   Wellness examination 10/08/2022   Cough 08/14/2022   History of tobacco use 08/14/2022   Past Medical History:  Diagnosis Date   Arthritis    hands   Breast lump    Dyspareunia    Osteoporosis    SVD (spontaneous vaginal delivery)    x 3   Past Surgical History:  Procedure Laterality Date   BREAST SURGERY     breast augmentation   COLONOSCOPY     DILATATION & CURETTAGE/HYSTEROSCOPY WITH MYOSURE N/A 05/31/2014   Procedure: DILATATION & CURETTAGE/ DIAGNOSTIC HYSTEROSCOPY;  Surgeon: Bobie FORBES Crown de Charlynn FORBES Cary, MD;  Location: [facility];  Service: Gynecology;  Laterality: N/A;   [facility] duct surgery  1972   NASAL SEPTUM SURGERY  1980's   POLYPECTOMY     WISDOM TOOTH EXTRACTION     Family History  Problem Relation  Age of Onset   Stroke Mother 58       dec   Diabetes Mother    Cancer Father 38       dec colon ca   Colon cancer Father    Other Brother 66       dec-MVA   Multiple sclerosis Sister    Multiple sclerosis Sister    Colitis Sister    Stroke Maternal Aunt    Rectal cancer Neg Hx    Stomach cancer Neg Hx    Outpatient Medications Prior to Visit  Medication Sig Dispense Refill   albuterol  (VENTOLIN  HFA) 108 (90 Base) MCG/ACT inhaler Inhale 2 puffs into the lungs every 6 (six) hours as needed for wheezing or shortness of breath. 8.5 g 2   Apoaequorin (PREVAGEN PO) Take 1 tablet by mouth daily.     Ascorbic Acid (VITAMIN C) 1000 MG tablet Take 1,000 mg by mouth daily.     aspirin EC 81 MG tablet Take 81 mg by mouth daily.     calcium carbonate (OS-CAL) 600 MG TABS tablet Take 600 mg by mouth 2 (two) times daily with a meal.     Cholecalciferol (VITAMIN D3) 1000 units CAPS Take 1 capsule by mouth daily.     cyanocobalamin  (VITAMIN B12) 1000 MCG tablet Take 1,000 mcg by mouth daily.     doxylamine, Sleep, (UNISOM) 25 MG tablet Take 25 mg by mouth at bedtime as needed for  sleep.     ibuprofen  (ADVIL ,MOTRIN ) 800 MG tablet Take 1 tablet (800 mg total) by mouth every 8 (eight) hours as needed. 30 tablet 0   KRILL OIL PO Take 1 tablet by mouth daily.     mirabegron  ER (MYRBETRIQ ) 25 MG TB24 tablet Take 1 tablet (25 mg total) by mouth daily. 90 tablet 1   Multiple Vitamin (MULTIVITAMIN) capsule Take 1 capsule by mouth daily.     Multiple Vitamins-Minerals (MULTIVITAMIN WITH MINERALS) tablet Take 1 tablet by mouth daily.     pantoprazole  (PROTONIX ) 40 MG tablet Take 1 tablet (40 mg total) by mouth daily. (Patient taking differently: Take 40 mg by mouth as needed.) 90 tablet 1   Probiotic Product (ALIGN PO) Take by mouth.     RESVERATROL PO Take 1 tablet by mouth daily.     thiamine (VITAMIN B-1) 100 MG tablet Take 100 mg by mouth daily.     azithromycin  (ZITHROMAX ) 250 MG tablet Take 2 tablets on  day 1, then 1 tablet daily on days 2 through 5 6 tablet 0   predniSONE  (STERAPRED UNI-PAK 21 TAB) 10 MG (21) TBPK tablet Use as directed. 21 each 0   No facility-administered medications prior to visit.   Allergies  Allergen Reactions   Talwin [Pentazocine] Other (See Comments)    hallucinations     ROS: A complete ROS was performed with pertinent positives/negatives noted in the HPI. The remainder of the ROS are negative.    Objective:   Today's Vitals   07/29/23 1113  BP: (!) 147/78  Pulse: 74  Temp: 98.1 F (36.7 C)  TempSrc: Oral  SpO2: 96%  Weight: 127 lb 14.4 oz (58 kg)  Height: 5' 4 (1.626 m)    GENERAL: Well-appearing, in NAD. Well nourished.  SKIN: Pink, warm and dry. No rash, lesion, ulceration, or ecchymoses.  Head: Normocephalic. NECK: Trachea midline. Full ROM w/o pain or tenderness. No lymphadenopathy.  NOSE: Septum midline w/o deformity. Nares patent, mucosa pink and non-inflamed w/o drainage. No sinus tenderness.  THROAT: Uvula midline. Oropharynx clear.  Mucous membranes pink and moist.  RESPIRATORY: Chest wall symmetrical. Respirations even and non-labored. Breath sounds with mild expiratory wheezing and rhonchi present bilaterally to auscultation to lower lung bases pre-neb treatment. Post breathing treatment, patient had improvement and clearance of rhonchi and expiratory wheezing to bilateral lower lung fields.  Cough is congested, non productive. CARDIAC: S1, S2 present, regular rate and rhythm without murmur or gallops. Peripheral pulses 2+ bilaterally.  MSK: Muscle tone and strength appropriate for age.  EXTREMITIES: Without clubbing, cyanosis, or edema.  NEUROLOGIC: No motor or sensory deficits. Steady, even gait. C2-C12 intact.  PSYCH/MENTAL STATUS: Alert, oriented x 3. Cooperative, appropriate mood and affect.    No results found for any visits on 07/29/23.    Assessment & Plan:  1. Acute bronchitis, unspecified organism (Primary) Likely  ongoing congestion due to bronchitis and COPD. Patient had improvement with Duoneb treatment in office. Will pick up Neb machine and Duoneb sent in to pharmacy and start using Q6 hours with Mucinex BID. Will rule out PNA with chest xray today and notify pt of results when available. Pt to return if no improvement in 48 hours.   - ipratropium-albuterol  (DUONEB) 0.5-2.5 (3) MG/3ML nebulizer solution 3 mL - DG Chest 2 View; Future   Meds ordered this encounter  Medications   ipratropium-albuterol  (DUONEB) 0.5-2.5 (3) MG/3ML nebulizer solution 3 mL   ipratropium-albuterol  (DUONEB) 0.5-2.5 (3) MG/3ML SOLN  Sig: Take 3 mLs by nebulization every 6 (six) hours as needed (shortness of breath and cough).    Dispense:  360 mL    Refill:  1    Supervising Provider:   DE CUBA, RAYMOND J [[id number]]    Return if symptoms worsen or fail to improve.    Patient to reach out to office if new, worrisome, or unresolved symptoms arise or if no improvement in patient's condition. Patient verbalized understanding and is agreeable to treatment plan. All questions answered to patient's satisfaction.    Thersia Schuyler Stark, [place]

## 2023-08-15 NOTE — Progress Notes (Signed)
I saw Christine Romero in neurology clinic on 08/27/23 in follow up for numbness and tingling in feet.  HPI: Christine Romero is a 78 y.o. year old female with a history of HLD, OA who we last saw on 02/19/23.  To briefly review: 02/19/23: Patient has had symptoms for a couple of years. She first noticed symptoms while in bed, thinking she still had her socks on, but she didn't. She had some numbness and tingling in her feet. Over the years, symptoms do feel more pronounced. She still thinks her symptoms are only in her feet or maybe into lower legs. She denies weakness in her legs, imbalance, or falls. She gets occasional cramps in her legs at night, but has been better lately as she is drinking more water. She denies symptoms in her arms.   The patient denies symptoms suggestive of oculobulbar weakness including diplopia, ptosis, poor saliva control, dysarthria/dysphonia, impaired mastication, facial weakness/droop. She does mention that occasionally feeling like she has difficulty swallowing. She has had a swallowing test that was normal, per her report. She was treated for reflux with some improvement.   There are no neuromuscular respiratory weakness symptoms, particularly orthopnea>dyspnea.    The patient does not report symptoms referable to autonomic dysfunction including impaired sweating, heat or cold intolerance, excessive mucosal dryness, postprandial abdominal bloating, constipation, bowel or bladder dyscontrol, or syncope/presyncope/orthostatic intolerance. She does endorse some abdominal bloating.   She report any constitutional symptoms like fever, night sweats, anorexia or unintentional weight loss.   Patient is on a lot of supplements including vitamin D, B12 because she wants to be healthy (not previously deficient).   EtOH use: rare, socially (once every couple of month)  Restrictive diet? Does not eat as much as previous Family history of neuropathy/myopathy/neurologic  disease? No  Most recent Assessment and Plan (02/19/23): Christine Romero is a 78 y.o. female who presents for evaluation of numbness and tingling in bilateral feet. She has a relevant medical history of HLD, OA . Her neurological examination is pertinent for diminished vibratory sensation in bilateral feet and ankles. Available diagnostic data is significant for HbA1c of 5.7. Patient's symptoms are most consistent with a very mild distal symmetric polyneuropathy. She has very mild pre-diabetes, but otherwise, no known risk factors. I will send labs to look for treatable causes.   PLAN: -Blood work: B1, B6, B12, IFE -Discussed medications for neuropathic pain, but patient does not find current symptoms bothersome enough to warrant treatment -Recommend alpha lipoic acid 600 mg once or twice daily -Lidocaine cream PRN  Since their last visit: Labs showed a mildly elevated B6, but otherwise normal. She thinks symptoms are similar to prior, maybe slightly worse. It is still tolerable. It does not stop her from doing anything. She denies imbalance or falls.  She takes alpha lipoic acid sometimes. She did not try lidocaine cream.  She has no new issues to discuss today.   MEDICATIONS:  Outpatient Encounter Medications as of 08/27/2023  Medication Sig   albuterol (VENTOLIN HFA) 108 (90 Base) MCG/ACT inhaler Inhale 2 puffs into the lungs every 6 (six) hours as needed for wheezing or shortness of breath.   Apoaequorin (PREVAGEN PO) Take 1 tablet by mouth daily.   Ascorbic Acid (VITAMIN C) 1000 MG tablet Take 1,000 mg by mouth daily.   calcium carbonate (OS-CAL) 600 MG TABS tablet Take 600 mg by mouth 2 (two) times daily with a meal.   Cholecalciferol (VITAMIN D3) 1000 units CAPS  Take 1 capsule by mouth daily.   doxylamine, Sleep, (UNISOM) 25 MG tablet Take 25 mg by mouth at bedtime as needed for sleep.   ibuprofen (ADVIL,MOTRIN) 800 MG tablet Take 1 tablet (800 mg total) by mouth every 8 (eight)  hours as needed.   ipratropium-albuterol (DUONEB) 0.5-2.5 (3) MG/3ML SOLN Take 3 mLs by nebulization every 6 (six) hours as needed (shortness of breath and cough).   KRILL OIL PO Take 1 tablet by mouth daily.   mirabegron ER (MYRBETRIQ) 25 MG TB24 tablet Take 1 tablet (25 mg total) by mouth daily.   Multiple Vitamin (MULTIVITAMIN) capsule Take 1 capsule by mouth daily.   pantoprazole (PROTONIX) 40 MG tablet Take 1 tablet (40 mg total) by mouth daily. (Patient taking differently: Take 40 mg by mouth as needed.)   Probiotic Product (ALIGN PO) Take by mouth.   RESVERATROL PO Take 1 tablet by mouth daily.   thiamine (VITAMIN B-1) 100 MG tablet Take 100 mg by mouth daily.   aspirin EC 81 MG tablet Take 81 mg by mouth daily. (Patient not taking: Reported on 08/27/2023)   cyanocobalamin (VITAMIN B12) 1000 MCG tablet Take 1,000 mcg by mouth daily. (Patient not taking: Reported on 08/27/2023)   Multiple Vitamins-Minerals (MULTIVITAMIN WITH MINERALS) tablet Take 1 tablet by mouth daily. (Patient not taking: Reported on 08/27/2023)   No facility-administered encounter medications on file as of 08/27/2023.    PAST MEDICAL HISTORY: Past Medical History:  Diagnosis Date   Arthritis    hands   Breast lump    Dyspareunia    Osteoporosis    SVD (spontaneous vaginal delivery)    x 3    PAST SURGICAL HISTORY: Past Surgical History:  Procedure Laterality Date   BREAST SURGERY     breast augmentation   COLONOSCOPY     DILATATION & CURETTAGE/HYSTEROSCOPY WITH MYOSURE N/A 05/31/2014   Procedure: DILATATION & CURETTAGE/ DIAGNOSTIC HYSTEROSCOPY;  Surgeon: Jacqualin Combes de Gwenevere Ghazi, MD;  Location: [facility];  Service: Gynecology;  Laterality: N/A;   gardners duct surgery  1972   NASAL SEPTUM SURGERY  1980's   POLYPECTOMY     WISDOM TOOTH EXTRACTION      ALLERGIES: Allergies  Allergen Reactions   Talwin [Pentazocine] Other (See Comments)    hallucinations    FAMILY HISTORY: Family History   Problem Relation Age of Onset   Stroke Mother 70       dec   Diabetes Mother    Cancer Father 42       dec colon ca   Colon cancer Father    Other Brother 69       dec-MVA   Multiple sclerosis Sister    Multiple sclerosis Sister    Colitis Sister    Stroke Maternal Aunt    Rectal cancer Neg Hx    Stomach cancer Neg Hx     SOCIAL HISTORY: Social History   Tobacco Use   Smoking status: Former    Current packs/day: 0.00    Average packs/day: 1 pack/day for 50.0 years (50.0 ttl pk-yrs)    Types: Cigarettes    Start date: 1964    Quit date: 2014    Years since quitting: 11.0   Smokeless tobacco: Never  Vaping Use   Vaping status: Never Used  Substance Use Topics   Alcohol use: No    Alcohol/week: 0.0 standard drinks of alcohol    Comment: socially   Drug use: No   Social History  Social History Narrative   Are you right handed or left handed? right   Are you currently employed ? no   What is your current occupation?   Do you live at home alone?no   Who lives with you? husband   What type of home do you live in: 1 story or 2 story? one   Caffeine  0ne cup a day    Objective:  Vital Signs:  BP 118/77   Pulse 88   Ht 5\' 4"  (1.626 m)   Wt 128 lb (58.1 kg)   LMP 07/30/1983   SpO2 98%   BMI 21.97 kg/m   General: General appearance: Awake and alert. No distress. Cooperative with exam.  Skin: No obvious rash or jaundice. HEENT: Atraumatic. Anicteric. Lungs: Non-labored breathing on room air  Heart: Regular Abdomen: Soft, non tender. Extremities: No edema. No obvious deformity.  Musculoskeletal: No obvious joint swelling.  Neurological: Mental Status: Alert. Speech fluent. No pseudobulbar affect Cranial Nerves: CNII: No RAPD. Visual fields intact. CNIII, IV, VI: PERRL. No nystagmus. EOMI. CN V: Facial sensation intact bilaterally to fine touch. CN VII: Facial muscles symmetric and strong. No ptosis at rest. CN VIII: Hears finger rub well  bilaterally. CN IX: No hypophonia. CN X: Palate elevates symmetrically. CN XI: Full strength shoulder shrug bilaterally. CN XII: Tongue protrusion full and midline. No atrophy or fasciculations. No significant dysarthria Motor: Tone is normal. Strength is 5/5 in bilateral upper and lower extremities. Reflexes:  Right Left  Bicep 2+ 2+  Tricep 2+ 2+  BrRad 2+ 2+  Knee 2+ 2+  Ankle Trace Trace   Pathological Reflexes: Babinski: flexor response bilaterally Hoffman: absent bilaterally Sensation: Pinprick: Intact in all extremities Vibration: Diminished bilateral great toes and ankles Proprioception: Intact in bilateral great toes Coordination: Intact finger-to- nose-finger bilaterally. Romberg negative. Gait: Able to rise from chair with arms crossed unassisted. Normal, narrow-based gait.   Lab and Test Review: New results: 02/19/23: IFE no M protein B1 wnl B12: 568 B6: 23.2 (21.7 is the upper limit of normal)  Previously reviewed results: 10/08/22: HbA1c: 5.7 TSH: 2.150 CMP unremarkable CBC w/ diff unremarkable Lipid panel: Component     Latest Ref Rng 10/08/2022  Cholesterol, Total     100 - 199 mg/dL 161 (H)   Triglycerides     0 - 149 mg/dL 71   HDL Cholesterol     >39 mg/dL 92   VLDL Cholesterol Cal     5 - 40 mg/dL 13   LDL Chol Calc (NIH)     0 - 99 mg/dL 096 (H)   Total CHOL/HDL Ratio     0.0 - 4.4 ratio 2.3     ASSESSMENT: This is Jennette Dubin, a 78 y.o. female with numbness and tingling in her feet, consistent with a very mild distal symmetric polyneuropathy. She has very mild pre-DM but no other known risk factors. Her symptoms are currently mild and unchanged from prior.  Plan: -Alpha lipoic acid 600 mg once or twice daily -Lidocaine cream PRN -Again discussed neuropathic pain medications, patient declined -Fall precautions discussed  Return to clinic as needed   Jacquelyne Balint, MD

## 2023-08-22 ENCOUNTER — Ambulatory Visit: Payer: PPO | Admitting: Neurology

## 2023-08-27 ENCOUNTER — Ambulatory Visit (INDEPENDENT_AMBULATORY_CARE_PROVIDER_SITE_OTHER): Payer: PPO | Admitting: Neurology

## 2023-08-27 ENCOUNTER — Encounter: Payer: Self-pay | Admitting: Neurology

## 2023-08-27 VITALS — BP 118/77 | HR 88 | Ht 64.0 in | Wt 128.0 lb

## 2023-08-27 DIAGNOSIS — G629 Polyneuropathy, unspecified: Secondary | ICD-10-CM | POA: Diagnosis not present

## 2023-08-27 DIAGNOSIS — R202 Paresthesia of skin: Secondary | ICD-10-CM

## 2023-08-27 DIAGNOSIS — R7303 Prediabetes: Secondary | ICD-10-CM | POA: Diagnosis not present

## 2023-08-27 DIAGNOSIS — R200 Anesthesia of skin: Secondary | ICD-10-CM

## 2023-08-27 NOTE — Patient Instructions (Signed)
Alpha lipoic acid 600mg  daily has some research data suggesting it helps with nerve health. No major side effects other than <1% of people report upset stomach. This can be taken twice per day (1200mg  daily) if no relief obtained. You can buy this over the counter or online.  You can also try Lidocaine cream as needed. Apply wear you have pain, tingling, or burning. Wear gloves to prevent your hands being numb. This can be bought over the counter at any drug store or online.  The physicians and staff at [facility] are committed to providing excellent care. You may receive a survey requesting feedback about your experience at our office. We strive to receive "very good" responses to the survey questions. If you feel that your experience would prevent you from giving the office a "very good " response, please contact our office to try to remedy the situation. We may be reached at [phone]. Thank you for taking the time out of your busy day to complete the survey.  Jacquelyne Balint, MD [facility]  Preventing Falls at [facility] are common, often dreaded events in the lives of older people. Aside from the obvious injuries and even death that may result, fall can cause wide-ranging consequences including loss of independence, mental decline, decreased activity and mobility. Younger people are also at risk of falling, especially those with chronic illnesses and fatigue.  Ways to reduce risk for falling Examine diet and medications. Warm foods and alcohol dilate blood vessels, which can lead to dizziness when standing. Sleep aids, antidepressants and pain medications can also increase the likelihood of a fall.  Get a vision exam. Poor vision, cataracts and glaucoma increase the chances of falling.  Check foot gear. Shoes should fit snugly and have a sturdy, nonskid sole and a broad, low heel  Participate in a physician-approved exercise program to build and maintain muscle strength and  improve balance and coordination. Programs that use ankle weights or stretch bands are excellent for muscle-strengthening. Water aerobics programs and low-impact Tai Chi programs have also been shown to improve balance and coordination.  Increase vitamin D intake. Vitamin D improves muscle strength and increases the amount of calcium the body is able to absorb and deposit in bones.  How to prevent falls from common hazards Floors - Remove all loose wires, cords, and throw rugs. Minimize clutter. Make sure rugs are anchored and smooth. Keep furniture in its usual place.  Chairs -- Use chairs with straight backs, armrests and firm seats. Add firm cushions to existing pieces to add height.  Bathroom - Install grab bars and non-skid tape in the tub or shower. Use a bathtub transfer bench or a shower chair with a back support Use an elevated toilet seat and/or safety rails to assist standing from a low surface. Do not use towel racks or bathroom tissue holders to help you stand.  Lighting - Make sure halls, stairways, and entrances are well-lit. Install a night light in your bathroom or hallway. Make sure there is a light switch at the top and bottom of the staircase. Turn lights on if you get up in the middle of the night. Make sure lamps or light switches are within reach of the bed if you have to get up during the night.  Kitchen - Install non-skid rubber mats near the sink and stove. Clean spills immediately. Store frequently used utensils, pots, pans between waist and eye level. This helps prevent reaching and bending. Sit when getting things  out of lower cupboards.  Living room/ Bedrooms - Place furniture with wide spaces in between, giving enough room to move around. Establish a route through the living room that gives you something to hold onto as you walk.  Stairs - Make sure treads, rails, and rugs are secure. Install a rail on both sides of the stairs. If stairs are a threat, it might be  helpful to arrange most of your activities on the lower level to reduce the number of times you must climb the stairs.  Entrances and doorways - Install metal handles on the walls adjacent to the doorknobs of all doors to make it more secure as you travel through the doorway.  Tips for maintaining balance Keep at least one hand free at all times. Try using a backpack or fanny pack to hold things rather than carrying them in your hands. Never carry objects in both hands when walking as this interferes with keeping your balance.  Attempt to swing both arms from front to back while walking. This might require a conscious effort if Parkinson's disease has diminished your movement. It will, however, help you to maintain balance and posture, and reduce fatigue.  Consciously lift your feet off of the ground when walking. Shuffling and dragging of the feet is a common culprit in losing your balance.  When trying to navigate turns, use a "U" technique of facing forward and making a wide turn, rather than pivoting sharply.  Try to stand with your feet shoulder-length apart. When your feet are close together for any length of time, you increase your risk of losing your balance and falling.  Do one thing at a time. Don't try to walk and accomplish another task, such as reading or looking around. The decrease in your automatic reflexes complicates motor function, so the less distraction, the better.  Do not wear rubber or gripping soled shoes, they might "catch" on the floor and cause tripping.  Move slowly when changing positions. Use deliberate, concentrated movements and, if needed, use a grab bar or walking aid. Count 15 seconds between each movement. For example, when rising from a seated position, wait 15 seconds after standing to begin walking.  If balance is a continuous problem, you might want to consider a walking aid such as a cane, walking stick, or walker. Once you've mastered walking with help,  you might be ready to try it on your own again.

## 2023-10-08 ENCOUNTER — Other Ambulatory Visit (HOSPITAL_BASED_OUTPATIENT_CLINIC_OR_DEPARTMENT_OTHER): Payer: PPO

## 2023-10-08 DIAGNOSIS — Z0000 Encounter for general adult medical examination without abnormal findings: Secondary | ICD-10-CM

## 2023-10-09 ENCOUNTER — Ambulatory Visit (HOSPITAL_BASED_OUTPATIENT_CLINIC_OR_DEPARTMENT_OTHER): Payer: Self-pay | Admitting: Family Medicine

## 2023-10-09 ENCOUNTER — Ambulatory Visit: Admitting: Family Medicine

## 2023-10-09 LAB — CBC WITH DIFFERENTIAL/PLATELET
Basophils Absolute: 0 10*3/uL (ref 0.0–0.2)
Basos: 1 %
EOS (ABSOLUTE): 0.3 10*3/uL (ref 0.0–0.4)
Eos: 6 %
Hematocrit: 41.7 % (ref 34.0–46.6)
Hemoglobin: 13.8 g/dL (ref 11.1–15.9)
Immature Grans (Abs): 0 10*3/uL (ref 0.0–0.1)
Immature Granulocytes: 0 %
Lymphocytes Absolute: 2.4 10*3/uL (ref 0.7–3.1)
Lymphs: 53 %
MCH: 29 pg (ref 26.6–33.0)
MCHC: 33.1 g/dL (ref 31.5–35.7)
MCV: 88 fL (ref 79–97)
Monocytes Absolute: 0.5 10*3/uL (ref 0.1–0.9)
Monocytes: 10 %
Neutrophils Absolute: 1.4 10*3/uL (ref 1.4–7.0)
Neutrophils: 30 %
Platelets: 195 10*3/uL (ref 150–450)
RBC: 4.76 x10E6/uL (ref 3.77–5.28)
RDW: 12.1 % (ref 11.7–15.4)
WBC: 4.6 10*3/uL (ref 3.4–10.8)

## 2023-10-09 LAB — COMPREHENSIVE METABOLIC PANEL
ALT: 10 IU/L (ref 0–32)
AST: 16 IU/L (ref 0–40)
Albumin: 4.2 g/dL (ref 3.8–4.8)
Alkaline Phosphatase: 90 IU/L (ref 44–121)
BUN/Creatinine Ratio: 14 (ref 12–28)
BUN: 12 mg/dL (ref 8–27)
Bilirubin Total: 0.5 mg/dL (ref 0.0–1.2)
CO2: 22 mmol/L (ref 20–29)
Calcium: 9 mg/dL (ref 8.7–10.3)
Chloride: 104 mmol/L (ref 96–106)
Creatinine, Ser: 0.87 mg/dL (ref 0.57–1.00)
Globulin, Total: 2.2 g/dL (ref 1.5–4.5)
Glucose: 125 mg/dL — ABNORMAL HIGH (ref 70–99)
Potassium: 4 mmol/L (ref 3.5–5.2)
Sodium: 143 mmol/L (ref 134–144)
Total Protein: 6.4 g/dL (ref 6.0–8.5)
eGFR: 68 mL/min/{1.73_m2} (ref >59)

## 2023-10-09 LAB — LIPID PANEL
Chol/HDL Ratio: 2.5 ratio (ref 0.0–4.4)
Cholesterol, Total: 193 mg/dL (ref 100–199)
HDL: 76 mg/dL (ref >39)
LDL Chol Calc (NIH): 102 mg/dL — ABNORMAL HIGH (ref 0–99)
Triglycerides: 84 mg/dL (ref 0–149)
VLDL Cholesterol Cal: 15 mg/dL (ref 5–40)

## 2023-10-09 LAB — HEMOGLOBIN A1C
Est. average glucose Bld gHb Est-mCnc: 117 mg/dL
Hgb A1c MFr Bld: 5.7 % — ABNORMAL HIGH (ref 4.8–5.6)

## 2023-10-09 LAB — TSH RFX ON ABNORMAL TO FREE T4: TSH: 3.09 u[IU]/mL (ref 0.450–4.500)

## 2023-10-09 NOTE — Telephone Encounter (Signed)
 Called patient back offered first available same day appointment on 3/17 patient stated [facility] she would keep appt on 3/19 they did give her some medication

## 2023-10-09 NOTE — Telephone Encounter (Signed)
 Chief Complaint: cough Symptoms: dry cough, runny nose, headache and chest pain after coughing episode Frequency: x couple of months Pertinent Negatives: Patient denies hemoptysis, fever, SOB  Disposition: [] ED /[] Urgent Care (no appt availability in office) / [x] Appointment(In office/virtual)/ []  [facility]/ [] Home Care/ [] Refused Recommended Disposition /[] [facility]/ []  Follow-up with PCP Additional Notes: Patient calling in for chronic cough x couple of months with runny nose. She states the coughing episodes are constant and they almost have made her throw up a few times. She states she gets headaches and chest pain after a coughing episode. Patient requesting an office visit today for the symptoms she states she has been anxious due to the cough. Patient agreeable to be seen today for acute visit with Dr Clent Ridges at [facility].  Copied From CRM [id number]. Reason for Triage: Chronic deep cough, causing lost of anxiety. Pharmacist told patient that she needs an antibiotic so now she is very worried and wants to speak with a nurse    Reason for Disposition  [1] Chest or rib pain AND [2] only occurs while coughing  Answer Assessment - Initial Assessment Questions 1. ONSET: "When did the cough begin?"      Couple of months.  2. SEVERITY: "How bad is the cough today?"      She states on occasion she has felt like she could almost vomit 1-3 times from the coughing. She states it is constant.  3. SPUTUM: "Describe the color of your sputum" (none, dry cough; clear, white, yellow, green)     She states she is not able to cough up any of the mucus, patient gave example of cough and sounded dry.  4. HEMOPTYSIS: "Are you coughing up any blood?" If so ask: "How much?" (flecks, streaks, tablespoons, etc.)     Denies.  5. DIFFICULTY BREATHING: "Are you having difficulty breathing?" If Yes, ask: "How bad is it?" (e.g., mild, moderate, severe)    - MILD: No SOB at rest,  mild SOB with walking, speaks normally in sentences, can lie down, no retractions, pulse < 100.    - MODERATE: SOB at rest, SOB with minimal exertion and prefers to sit, cannot lie down flat, speaks in phrases, mild retractions, audible wheezing, pulse 100-120.    - SEVERE: Very SOB at rest, speaks in single words, struggling to breathe, sitting hunched forward, retractions, pulse > 120      Denies.  6. FEVER: "Do you have a fever?" If Yes, ask: "What is your temperature, how was it measured, and when did it start?"     Denies.  7. CARDIAC HISTORY: "Do you have any history of heart disease?" (e.g., heart attack, congestive heart failure)      Denies.  8. LUNG HISTORY: "Do you have any history of lung disease?"  (e.g., pulmonary embolus, asthma, emphysema)     She states she has a small growth in her left lung that she states they are keeping an eye on.  9. PE RISK FACTORS: "Do you have a history of blood clots?" (or: recent major surgery, recent prolonged travel, bedridden)     Denies.  10. OTHER SYMPTOMS: "Do you have any other symptoms?" (e.g., runny nose, wheezing, chest pain)       Anxiety, runny nose, chest pain and headache after coughing episodes.  11. PREGNANCY: "Is there any chance you are pregnant?" "When was your last menstrual period?"       N/A.  12. TRAVEL: "Have you traveled out  of the country in the last month?" (e.g., travel history, exposures)       Denies recent travel. She states she has been around people with the same cough.  Protocols used: Cough - Chronic-A-AH

## 2023-10-09 NOTE — Telephone Encounter (Signed)
 Copied from CRM [id number]. Topic: Appointments - Scheduling Inquiry for Clinic >> Oct 09, 2023  9:47 AM Christine Romero wrote: Reason for CRM: Patient needs to be seen for cough, runny nose x couple of months states it is causing her anxiety - Patient was scheduled at another location but was told she needs to be seen by PCP due to this being an ongoing condition and not acute.   Patient is wanting to be seen today, advised of next available appointment. Please assist with scheduling.

## 2023-10-09 NOTE — Telephone Encounter (Signed)
 Patient is scheduled with [facility] [place] 3/19 unfortunately if there are not openings unless there is a cancellation will need to keep this appointment Please let pt know

## 2023-10-14 ENCOUNTER — Encounter (HOSPITAL_BASED_OUTPATIENT_CLINIC_OR_DEPARTMENT_OTHER): Payer: Self-pay

## 2023-10-14 ENCOUNTER — Telehealth (HOSPITAL_BASED_OUTPATIENT_CLINIC_OR_DEPARTMENT_OTHER): Payer: Self-pay | Admitting: Family Medicine

## 2023-10-14 ENCOUNTER — Ambulatory Visit (HOSPITAL_BASED_OUTPATIENT_CLINIC_OR_DEPARTMENT_OTHER): Payer: PPO | Admitting: *Deleted

## 2023-10-14 DIAGNOSIS — Z0000 Encounter for general adult medical examination without abnormal findings: Secondary | ICD-10-CM | POA: Diagnosis not present

## 2023-10-14 NOTE — Patient Instructions (Signed)
 Christine Romero , Thank you for taking time to come for your Medicare Wellness Visit. I appreciate your ongoing commitment to your health goals. Please review the following plan we discussed and let me know if I can assist you in the future.   Screening recommendations/referrals: Colonoscopy: up to date Mammogram:  Bone Density: up to date Recommended yearly ophthalmology/optometry visit for glaucoma screening and checkup Recommended yearly dental visit for hygiene and checkup  Vaccinations: Influenza vaccine: up to date Pneumococcal vaccine: Education provided Tdap vaccine: Education provided Shingles vaccine: Education provided     Preventive Care 65 Years and Older, Female Preventive care refers to lifestyle choices and visits with your health care provider that can promote health and wellness. What does preventive care include? A yearly physical exam. This is also called an annual well check. Dental exams once or twice a year. Routine eye exams. Ask your health care provider how often you should have your eyes checked. Personal lifestyle choices, including: Daily care of your teeth and gums. Regular physical activity. Eating a healthy diet. Avoiding tobacco and drug use. Limiting alcohol use. Practicing safe sex. Taking low-dose aspirin every day. Taking vitamin and mineral supplements as recommended by your health care provider. What happens during an annual well check? The services and screenings done by your health care provider during your annual well check will depend on your age, overall health, lifestyle risk factors, and family history of disease. Counseling  Your health care provider may ask you questions about your: Alcohol use. Tobacco use. Drug use. Emotional well-being. Home and relationship well-being. Sexual activity. Eating habits. History of falls. Memory and ability to understand (cognition). Work and work Astronomer. Reproductive health. Screening   You may have the following tests or measurements: Height, weight, and BMI. Blood pressure. Lipid and cholesterol levels. These may be checked every 5 years, or more frequently if you are over 47 years old. Skin check. Lung cancer screening. You may have this screening every year starting at age 22 if you have a 30-pack-year history of smoking and currently smoke or have quit within the past 15 years. Fecal occult blood test (FOBT) of the stool. You may have this test every year starting at age 8. Flexible sigmoidoscopy or colonoscopy. You may have a sigmoidoscopy every 5 years or a colonoscopy every 10 years starting at age 7. Hepatitis C blood test. Hepatitis B blood test. Sexually transmitted disease (STD) testing. Diabetes screening. This is done by checking your blood sugar (glucose) after you have not eaten for a while (fasting). You may have this done every 1-3 years. Bone density scan. This is done to screen for osteoporosis. You may have this done starting at age 56. Mammogram. This may be done every 1-2 years. Talk to your health care provider about how often you should have regular mammograms. Talk with your health care provider about your test results, treatment options, and if necessary, the need for more tests. Vaccines  Your health care provider may recommend certain vaccines, such as: Influenza vaccine. This is recommended every year. Tetanus, diphtheria, and acellular pertussis (Tdap, Td) vaccine. You may need a Td booster every 10 years. Zoster vaccine. You may need this after age 75. Pneumococcal 13-valent conjugate (PCV13) vaccine. One dose is recommended after age 59. Pneumococcal polysaccharide (PPSV23) vaccine. One dose is recommended after age 14. Talk to your health care provider about which screenings and vaccines you need and how often you need them. This information is not intended to replace  advice given to you by your health care provider. Make sure you discuss  any questions you have with your health care provider. Document Released: 08/11/2015 Document Revised: 04/03/2016 Document Reviewed: 05/16/2015 Elsevier Interactive Patient Education  2017 [employer].  Fall Prevention in the Home Falls can cause injuries. They can happen to people of all ages. There are many things you can do to make your home safe and to help prevent falls. What can I do on the outside of my home? Regularly fix the edges of walkways and driveways and fix any cracks. Remove anything that might make you trip as you walk through a door, such as a raised step or threshold. Trim any bushes or trees on the path to your home. Use bright outdoor lighting. Clear any walking paths of anything that might make someone trip, such as rocks or tools. Regularly check to see if handrails are loose or broken. Make sure that both sides of any steps have handrails. Any raised decks and porches should have guardrails on the edges. Have any leaves, snow, or ice cleared regularly. Use sand or salt on walking paths during winter. Clean up any spills in your garage right away. This includes oil or grease spills. What can I do in the bathroom? Use night lights. Install grab bars by the toilet and in the tub and shower. Do not use towel bars as grab bars. Use non-skid mats or decals in the tub or shower. If you need to sit down in the shower, use a plastic, non-slip stool. Keep the floor dry. Clean up any water that spills on the floor as soon as it happens. Remove soap buildup in the tub or shower regularly. Attach bath mats securely with double-sided non-slip rug tape. Do not have throw rugs and other things on the floor that can make you trip. What can I do in the bedroom? Use night lights. Make sure that you have a light by your bed that is easy to reach. Do not use any sheets or blankets that are too big for your bed. They should not hang down onto the floor. Have a firm chair that has  side arms. You can use this for support while you get dressed. Do not have throw rugs and other things on the floor that can make you trip. What can I do in the kitchen? Clean up any spills right away. Avoid walking on wet floors. Keep items that you use a lot in easy-to-reach places. If you need to reach something above you, use a strong step stool that has a grab bar. Keep electrical cords out of the way. Do not use floor polish or wax that makes floors slippery. If you must use wax, use non-skid floor wax. Do not have throw rugs and other things on the floor that can make you trip. What can I do with my stairs? Do not leave any items on the stairs. Make sure that there are handrails on both sides of the stairs and use them. Fix handrails that are broken or loose. Make sure that handrails are as long as the stairways. Check any carpeting to make sure that it is firmly attached to the stairs. Fix any carpet that is loose or worn. Avoid having throw rugs at the top or bottom of the stairs. If you do have throw rugs, attach them to the floor with carpet tape. Make sure that you have a light switch at the top of the stairs and the bottom  of the stairs. If you do not have them, ask someone to add them for you. What else can I do to help prevent falls? Wear shoes that: Do not have high heels. Have rubber bottoms. Are comfortable and fit you well. Are closed at the toe. Do not wear sandals. If you use a stepladder: Make sure that it is fully opened. Do not climb a closed stepladder. Make sure that both sides of the stepladder are locked into place. Ask someone to hold it for you, if possible. Clearly mark and make sure that you can see: Any grab bars or handrails. First and last steps. Where the edge of each step is. Use tools that help you move around (mobility aids) if they are needed. These include: Canes. Walkers. Scooters. Crutches. Turn on the lights when you go into a dark area.  Replace any light bulbs as soon as they burn out. Set up your furniture so you have a clear path. Avoid moving your furniture around. If any of your floors are uneven, fix them. If there are any pets around you, be aware of where they are. Review your medicines with your doctor. Some medicines can make you feel dizzy. This can increase your chance of falling. Ask your doctor what other things that you can do to help prevent falls. This information is not intended to replace advice given to you by your health care provider. Make sure you discuss any questions you have with your health care provider. Document Released: 05/11/2009 Document Revised: 12/21/2015 Document Reviewed: 08/19/2014 Elsevier Interactive Patient Education  2017 [employer].

## 2023-10-14 NOTE — Progress Notes (Signed)
 Subjective:   Christine Romero is a 78 y.o. female who presents for Medicare Annual (Subsequent) preventive examination.  Visit Complete: Virtual I connected with  PENNELOPE BASQUE on 10/14/23 by a audio enabled telemedicine application and verified that I am speaking with the correct person using two identifiers.  Patient Location: Home  Provider Location: Home Office  I discussed the limitations of evaluation and management by telemedicine. The patient expressed understanding and agreed to proceed.  Vital Signs: Because this visit was a virtual/telehealth visit, some criteria may be missing or patient reported. Any vitals not documented were not able to be obtained and vitals that have been documented are patient reported.       Objective:    There were no vitals filed for this visit. There is no height or weight on file to calculate BMI.     10/14/2023    9:45 AM 08/27/2023    1:44 PM 02/19/2023   12:56 PM 10/08/2022    9:51 AM 05/25/2014    9:47 AM 05/12/2014   10:41 AM  Advanced Directives  Does Patient Have a Medical Advance Directive? Yes Yes Yes Yes Yes Yes  Type of Advance Directive Healthcare Power of Attorney Living will;Healthcare Power of [employer] Power of [employer] Power of [place];Living will Healthcare Power of [place];Living will Healthcare Power of Attorney  Does patient want to make changes to medical advance directive?    No - Patient declined  No - Patient declined  Copy of Healthcare Power of Attorney in Chart? No - copy requested   Yes - validated most recent copy scanned in chart (See row information)  No - copy requested    Current Medications (verified) Outpatient Encounter Medications as of 10/14/2023  Medication Sig   Apoaequorin (PREVAGEN PO) Take 1 tablet by mouth daily.   Ascorbic Acid (VITAMIN C) 1000 MG tablet Take 1,000 mg by mouth daily.   calcium carbonate (OS-CAL) 600 MG TABS tablet Take 600 mg by mouth 2 (two) times  daily with a meal.   Cholecalciferol (VITAMIN D3) 1000 units CAPS Take 1 capsule by mouth daily.   doxylamine, Sleep, (UNISOM) 25 MG tablet Take 25 mg by mouth at bedtime as needed for sleep.   ibuprofen (ADVIL,MOTRIN) 800 MG tablet Take 1 tablet (800 mg total) by mouth every 8 (eight) hours as needed.   KRILL OIL PO Take 1 tablet by mouth daily.   Probiotic Product (ALIGN PO) Take by mouth.   RESVERATROL PO Take 1 tablet by mouth daily.   thiamine (VITAMIN B-1) 100 MG tablet Take 100 mg by mouth daily.   albuterol (VENTOLIN HFA) 108 (90 Base) MCG/ACT inhaler Inhale 2 puffs into the lungs every 6 (six) hours as needed for wheezing or shortness of breath. (Patient not taking: Reported on 10/14/2023)   aspirin EC 81 MG tablet Take 81 mg by mouth daily. (Patient not taking: Reported on 10/14/2023)   cyanocobalamin (VITAMIN B12) 1000 MCG tablet Take 1,000 mcg by mouth daily. (Patient not taking: Reported on 10/14/2023)   ipratropium-albuterol (DUONEB) 0.5-2.5 (3) MG/3ML SOLN Take 3 mLs by nebulization every 6 (six) hours as needed (shortness of breath and cough). (Patient not taking: Reported on 10/14/2023)   mirabegron ER (MYRBETRIQ) 25 MG TB24 tablet Take 1 tablet (25 mg total) by mouth daily. (Patient not taking: Reported on 10/14/2023)   Multiple Vitamin (MULTIVITAMIN) capsule Take 1 capsule by mouth daily.   Multiple Vitamins-Minerals (MULTIVITAMIN WITH MINERALS) tablet Take 1 tablet by  mouth daily. (Patient not taking: Reported on 08/27/2023)   pantoprazole (PROTONIX) 40 MG tablet Take 1 tablet (40 mg total) by mouth daily. (Patient not taking: Reported on 10/14/2023)   No facility-administered encounter medications on file as of 10/14/2023.    Allergies (verified) Talwin [pentazocine]   History: Past Medical History:  Diagnosis Date   Arthritis    hands   Breast lump    Dyspareunia    Osteoporosis    SVD (spontaneous vaginal delivery)    x 3   Past Surgical History:  Procedure  Laterality Date   BREAST SURGERY     breast augmentation   COLONOSCOPY     DILATATION & CURETTAGE/HYSTEROSCOPY WITH MYOSURE N/A 05/31/2014   Procedure: DILATATION & CURETTAGE/ DIAGNOSTIC HYSTEROSCOPY;  Surgeon: Jacqualin Combes de Gwenevere Ghazi, MD;  Location: [facility];  Service: Gynecology;  Laterality: N/A;   gardners duct surgery  1972   NASAL SEPTUM SURGERY  1980's   POLYPECTOMY     WISDOM TOOTH EXTRACTION     Family History  Problem Relation Age of Onset   Stroke Mother 70       dec   Diabetes Mother    Cancer Father 73       dec colon ca   Colon cancer Father    Other Brother 23       dec-MVA   Multiple sclerosis Sister    Multiple sclerosis Sister    Colitis Sister    Stroke Maternal Aunt    Rectal cancer Neg Hx    Stomach cancer Neg Hx    Social History   Socioeconomic History   Marital status: Married    Spouse name: Not on file   Number of children: Not on file   Years of education: Not on file   Highest education level: Not on file  Occupational History   Not on file  Tobacco Use   Smoking status: Former    Current packs/day: 0.00    Average packs/day: 1 pack/day for 50.0 years (50.0 ttl pk-yrs)    Types: Cigarettes    Start date: 69    Quit date: 2014    Years since quitting: 11.2   Smokeless tobacco: Never  Vaping Use   Vaping status: Never Used  Substance and Sexual Activity   Alcohol use: No    Alcohol/week: 0.0 standard drinks of alcohol    Comment: socially   Drug use: No   Sexual activity: Not Currently    Partners: Male    Birth control/protection: Post-menopausal    Comment: postmenopausal  Other Topics Concern   Not on file  Social History Narrative   Are you right handed or left handed? right   Are you currently employed ? no   What is your current occupation?   Do you live at home alone?no   Who lives with you? husband   What type of home do you live in: 1 story or 2 story? one   Caffeine  0ne cup a day   Social Drivers of  Corporate investment banker Strain: Low Risk  (10/14/2023)   Overall Financial Resource Strain (CARDIA)    Difficulty of Paying Living Expenses: Not hard at all  Food Insecurity: No Food Insecurity (10/14/2023)   Hunger Vital Sign    Worried About Running Out of Food in the Last Year: Never true    Ran Out of Food in the Last Year: Never true  Transportation Needs: No Transportation Needs (10/14/2023)  PRAPARE - Administrator, Civil Service (Medical): No    Lack of Transportation (Non-Medical): No  Physical Activity: Inactive (10/14/2023)   Exercise Vital Sign    Days of Exercise per Week: 0 days    Minutes of Exercise per Session: 0 min  Stress: No Stress Concern Present (10/14/2023)   [employer] of Occupational Health - Occupational Stress Questionnaire    Feeling of Stress : Not at all  Social Connections: Socially Integrated (10/14/2023)   Social Connection and Isolation Panel [NHANES]    Frequency of Communication with Friends and Family: More than three times a week    Frequency of Social Gatherings with Friends and Family: More than three times a week    Attends Religious Services: More than 4 times per year    Active Member of [employer] or Organizations: Yes    Attends Engineer, structural: More than 4 times per year    Marital Status: Married    Tobacco Counseling Counseling given: Not Answered   Clinical Intake:  Pre-visit preparation completed: Yes  Pain : No/denies pain     Diabetes: No  How often do you need to have someone help you when you read instructions, pamphlets, or other written materials from your doctor or pharmacy?: 1 - Never  Interpreter Needed?: No  Information entered by :: Remi Haggard LPN   Activities of Daily Living    10/14/2023    9:46 AM  In your present state of health, do you have any difficulty performing the following activities:  Hearing? 0  Vision? 0  Difficulty concentrating or making decisions? 0   Walking or climbing stairs? 0  Dressing or bathing? 0  Doing errands, shopping? 0  Preparing Food and eating ? N  Using the Toilet? N  In the past six months, have you accidently leaked urine? Y  Do you have problems with loss of bowel control? N  Managing your Medications? N  Managing your Finances? N  Housekeeping or managing your Housekeeping? N    Patient Care Team: [place], Buren Kos, MD as PCP - General (Family Medicine)  Indicate any recent Medical Services you may have received from other than [facility] providers in the past year (date may be approximate).     Assessment:   This is a routine wellness examination for [place].  Hearing/Vision screen Hearing Screening - Comments:: No trouble hearing Vision Screening - Comments:: Up to date [facility]    Goals Addressed               This Visit's Progress     Patient Stated        Stay healthy      Stay healthy and alive (pt-stated)   On track      Depression Screen    10/14/2023    9:48 AM 07/25/2023    8:50 AM 05/16/2023    9:51 AM 01/09/2023   10:24 AM 10/08/2022    9:27 AM 08/14/2022    1:59 PM  PHQ 2/9 Scores  PHQ - 2 Score 0 0 0 0 0 0  PHQ- 9 Score 4 4 0 5 0 0  Exception Documentation      Medical reason    Fall Risk    10/14/2023   10:03 AM 08/27/2023    1:44 PM 07/25/2023    8:50 AM 05/16/2023    9:51 AM 02/19/2023   12:55 PM  Fall Risk   Falls in the past year? 0  0 0 0 0  Number falls in past yr: 0 0 0 0 0  Injury with Fall? 0 0 0 0 0  Risk for fall due to :   No Fall Risks No Fall Risks   Follow up Falls evaluation completed;Education provided;Falls prevention discussed Falls evaluation completed Falls evaluation completed Falls evaluation completed Falls evaluation completed    MEDICARE RISK AT HOME: Medicare Risk at Home Any stairs in or around the home?: No If so, are there any without handrails?: No Home free of loose throw rugs in walkways, pet beds, electrical cords, etc?:  Yes Adequate lighting in your home to reduce risk of falls?: Yes Life alert?: No Use of a cane, walker or w/c?: No Grab bars in the bathroom?: No Shower chair or bench in shower?: Yes Elevated toilet seat or a handicapped toilet?: No  TIMED UP AND GO:  Was the test performed?  No    Cognitive Function:        10/14/2023    9:47 AM 10/08/2022    9:51 AM  6CIT Screen  What Year? 0 points 0 points  What month? 0 points 0 points  What time? 0 points 0 points  Count back from 20 0 points 0 points  Months in reverse 0 points 0 points  Repeat phrase 0 points 0 points  Total Score 0 points 0 points    Immunizations Immunization History  Administered Date(s) Administered   Fluad Trivalent(High Dose 65+) 05/16/2023   Hepatitis A, Adult 07/17/2018, 01/26/2019    TDAP status: Due, Education has been provided regarding the importance of this vaccine. Advised may receive this vaccine at local pharmacy or Health Dept. Aware to provide a copy of the vaccination record if obtained from local pharmacy or Health Dept. Verbalized acceptance and understanding.  Flu Vaccine status: Up to date  Pneumococcal vaccine status: Due, Education has been provided regarding the importance of this vaccine. Advised may receive this vaccine at local pharmacy or Health Dept. Aware to provide a copy of the vaccination record if obtained from local pharmacy or Health Dept. Verbalized acceptance and understanding.  Covid-19 vaccine status: Information provided on how to obtain vaccines.   Qualifies for Shingles Vaccine? Yes   Zostavax completed No   Shingrix Completed?: No.    Education has been provided regarding the importance of this vaccine. Patient has been advised to call insurance company to determine out of pocket expense if they have not yet received this vaccine. Advised may also receive vaccine at local pharmacy or Health Dept. Verbalized acceptance and understanding.  Screening Tests Health  Maintenance  Topic Date Due   Hepatitis C Screening  Never done   DTaP/Tdap/Td (1 - Tdap) Never done   Zoster Vaccines- Shingrix (1 of 2) Never done   Pneumonia Vaccine 69+ Years old (1 of 1 - PCV) Never done   COVID-19 Vaccine (1 - 2024-25 season) Never done   Lung Cancer Screening  01/23/2024   Medicare Annual Wellness (AWV)  10/13/2024   Colonoscopy  04/09/2028   INFLUENZA VACCINE  Completed   DEXA SCAN  Completed   HPV VACCINES  Aged Out    Health Maintenance  Health Maintenance Due  Topic Date Due   Hepatitis C Screening  Never done   DTaP/Tdap/Td (1 - Tdap) Never done   Zoster Vaccines- Shingrix (1 of 2) Never done   Pneumonia Vaccine 34+ Years old (1 of 1 - PCV) Never done   COVID-19 Vaccine (1 - 2024-25  season) Never done    Colorectal cancer screening: Type of screening: Colonoscopy. Completed 2024. Repeat every 5 years  Mammogram status: No longer required due to  .  Bone Density status: Completed 2024. Results reflect: Bone density results: OSTEOPENIA. Repeat every 5 years.  Lung Cancer Screening: (Low Dose CT Chest recommended if Age 19-80 years, 20 pack-year currently smoking OR have quit w/in 15years.) does qualify.   Lung Cancer Screening Referral: due in 12-2023  Additional Screening:  Hepatitis C Screening:  never done  Vision Screening: Recommended annual ophthalmology exams for early detection of glaucoma and other disorders of the eye. Is the patient up to date with their annual eye exam?  Yes  Who is the provider or what is the name of the office in which the patient attends annual eye exams?  [facility] If pt is not established with a provider, would they like to be referred to a provider to establish care? No .   Dental Screening: Recommended annual dental exams for proper oral hygiene    Community Resource Referral / Chronic Care Management: CRR required this visit?  No   CCM required this visit?  No     Plan:     I have personally  reviewed and noted the following in the patient's chart:   Medical and social history Use of alcohol, tobacco or illicit drugs  Current medications and supplements including opioid prescriptions. Patient is not currently taking opioid prescriptions. Functional ability and status Nutritional status Physical activity Advanced directives List of other physicians Hospitalizations, surgeries, and ER visits in previous 12 months Vitals Screenings to include cognitive, depression, and falls Referrals and appointments  In addition, I have reviewed and discussed with patient certain preventive protocols, quality metrics, and best practice recommendations. A written personalized care plan for preventive services as well as general preventive health recommendations were provided to patient.     Remi Haggard, LPN   [id number]/61/[id number]   After Visit Summary: (MyChart) Due to this being a telephonic visit, the after visit summary with patients personalized plan was offered to patient via MyChart   Nurse Notes:

## 2023-10-14 NOTE — Telephone Encounter (Signed)
 Pt stated she is still experiencing a constant cough even after visiting urgent care last week. She stated she received an antibiotic but it is not helping please advise pt

## 2023-10-15 ENCOUNTER — Encounter (HOSPITAL_BASED_OUTPATIENT_CLINIC_OR_DEPARTMENT_OTHER): Payer: PPO | Admitting: Family Medicine

## 2023-10-15 NOTE — Telephone Encounter (Signed)
 LVM for pt to call the office.

## 2023-10-16 NOTE — Telephone Encounter (Signed)
 Patient advised with verbal understanding. Will wait a few more weeks to see if cough gets better and if not will let us know and then we can place referral to pulmonology

## 2023-10-22 ENCOUNTER — Ambulatory Visit (INDEPENDENT_AMBULATORY_CARE_PROVIDER_SITE_OTHER): Admitting: Family Medicine

## 2023-10-22 ENCOUNTER — Encounter (HOSPITAL_BASED_OUTPATIENT_CLINIC_OR_DEPARTMENT_OTHER): Payer: Self-pay | Admitting: Family Medicine

## 2023-10-22 VITALS — BP 128/79 | HR 77 | Ht 65.0 in | Wt 128.8 lb

## 2023-10-22 DIAGNOSIS — R053 Chronic cough: Secondary | ICD-10-CM

## 2023-10-22 DIAGNOSIS — Z0000 Encounter for general adult medical examination without abnormal findings: Secondary | ICD-10-CM

## 2023-10-22 DIAGNOSIS — Z87891 Personal history of nicotine dependence: Secondary | ICD-10-CM | POA: Diagnosis not present

## 2023-10-22 NOTE — Progress Notes (Signed)
 Subjective:    CC: Annual Physical Exam  HPI: Christine Romero is a 78 y.o. presenting for annual physical  I reviewed the past medical history, family history, social history, surgical history, and allergies today and no changes were needed.  Please see the problem list section below in epic for further details.  Past Medical History: Past Medical History:  Diagnosis Date   Arthritis    hands   Breast lump    Dyspareunia    Osteoporosis    SVD (spontaneous vaginal delivery)    x 3   Past Surgical History: Past Surgical History:  Procedure Laterality Date   BREAST SURGERY     breast augmentation   COLONOSCOPY     DILATATION & CURETTAGE/HYSTEROSCOPY WITH MYOSURE N/A 05/31/2014   Procedure: DILATATION & CURETTAGE/ DIAGNOSTIC HYSTEROSCOPY;  Surgeon: Jacqualin Combes de Gwenevere Ghazi, MD;  Location: [facility];  Service: Gynecology;  Laterality: N/A;   gardners duct surgery  1972   NASAL SEPTUM SURGERY  1980's   POLYPECTOMY     WISDOM TOOTH EXTRACTION     Social History: Social History   Socioeconomic History   Marital status: Married    Spouse name: Not on file   Number of children: Not on file   Years of education: Not on file   Highest education level: Not on file  Occupational History   Not on file  Tobacco Use   Smoking status: Former    Current packs/day: 0.00    Average packs/day: 1 pack/day for 50.0 years (50.0 ttl pk-yrs)    Types: Cigarettes    Start date: 41    Quit date: 2014    Years since quitting: 11.2    Passive exposure: Past   Smokeless tobacco: Never  Vaping Use   Vaping status: Never Used  Substance and Sexual Activity   Alcohol use: No    Alcohol/week: 0.0 standard drinks of alcohol    Comment: socially   Drug use: No   Sexual activity: Not Currently    Partners: Male    Birth control/protection: Post-menopausal    Comment: postmenopausal  Other Topics Concern   Not on file  Social History Narrative   Are you right handed or left  handed? right   Are you currently employed ? no   What is your current occupation?   Do you live at home alone?no   Who lives with you? husband   What type of home do you live in: 1 story or 2 story? one   Caffeine  0ne cup a day   Social Drivers of Corporate investment banker Strain: Low Risk  (10/14/2023)   Overall Financial Resource Strain (CARDIA)    Difficulty of Paying Living Expenses: Not hard at all  Food Insecurity: No Food Insecurity (10/14/2023)   Hunger Vital Sign    Worried About Running Out of Food in the Last Year: Never true    Ran Out of Food in the Last Year: Never true  Transportation Needs: No Transportation Needs (10/14/2023)   PRAPARE - Administrator, Civil Service (Medical): No    Lack of Transportation (Non-Medical): No  Physical Activity: Inactive (10/14/2023)   Exercise Vital Sign    Days of Exercise per Week: 0 days    Minutes of Exercise per Session: 0 min  Stress: No Stress Concern Present (10/14/2023)   [employer] of Occupational Health - Occupational Stress Questionnaire    Feeling of Stress : Not at  all  Social Connections: Socially Integrated (10/14/2023)   Social Connection and Isolation Panel [NHANES]    Frequency of Communication with Friends and Family: More than three times a week    Frequency of Social Gatherings with Friends and Family: More than three times a week    Attends Religious Services: More than 4 times per year    Active Member of [employer] or Organizations: Yes    Attends Engineer, structural: More than 4 times per year    Marital Status: Married   Family History: Family History  Problem Relation Age of Onset   Stroke Mother 14       dec   Diabetes Mother    Cancer Father 29       dec colon ca   Colon cancer Father    Other Brother 30       dec-MVA   Multiple sclerosis Sister    Multiple sclerosis Sister    Colitis Sister    Stroke Maternal Aunt    Rectal cancer Neg Hx    Stomach cancer Neg Hx     Allergies: Allergies  Allergen Reactions   Talwin [Pentazocine] Other (See Comments)    hallucinations   Medications: See med rec.  Review of Systems: No headache, visual changes, nausea, vomiting, diarrhea, constipation, dizziness, abdominal pain, skin rash, fevers, chills, night sweats, swollen lymph nodes, weight loss, chest pain, body aches, joint swelling, muscle aches, shortness of breath, mood changes, visual or auditory hallucinations.  Objective:    BP 128/79 (BP Location: Left Arm, Patient Position: Sitting, Cuff Size: Normal)   Pulse 77   Ht 5\' 5"  (1.651 m)   Wt 128 lb 12.8 oz (58.4 kg)   LMP 07/30/1983   SpO2 99%   BMI 21.43 kg/m   General: Well Developed, well nourished, and in no acute distress. Neuro: Alert and oriented x3, extra-ocular muscles intact, sensation grossly intact. Cranial nerves II through XII are intact, motor, sensory, and coordinative functions are all intact. HEENT: Normocephalic, atraumatic, pupils equal round reactive to light, neck supple, no masses, no lymphadenopathy, thyroid nonpalpable. Oropharynx, nasopharynx, external ear canals are unremarkable. Skin: Warm and dry, no rashes noted. Cardiac: Regular rate and rhythm, no murmurs rubs or gallops. Respiratory: Clear to auscultation bilaterally. Not using accessory muscles, speaking in full sentences.  Abdominal: Soft, nontender, nondistended, positive bowel sounds, no masses, no organomegaly.  Musculoskeletal: Shoulder, elbow, wrist, hip, knee, ankle stable, and with full range of motion.  Impression and Recommendations:    Wellness examination Assessment & Plan: Routine HCM labs reviewed. HCM reviewed/discussed. Anticipatory guidance regarding healthy weight, lifestyle and choices given. Recommend healthy diet.  Recommend approximately 150 minutes/week of moderate intensity exercise Recommend regular dental and vision exams Always use seatbelt/lap and shoulder restraints Recommend using  smoke alarms and checking batteries at least twice a year Recommend using sunscreen when outside Discussed colon cancer screening recommendations Discussed immunization recommendations   Chronic cough Assessment & Plan: Patient reports that she has had ongoing cough.  She initially was evaluated in our office at the end of December.  Had x-ray completed at that time which was reassuring.  She indicates that she has had persistent cough.  She has been seen at urgent care more recently and diagnosed with bronchitis.  Cough tends to occur throughout the day without any specific time of day where seems to be worse.  No significant shortness of breath or trouble breathing with ongoing cough. Exam today, lungs clear to auscultation  bilaterally, no significant wheezing patient with no increased work of breathing or obvious dyspnea in the office. Given continued symptoms, history of tobacco use, changes noted on prior CT imaging, feel that patient would benefit from further evaluation with pulmonology, patient amenable, referral placed today.  Orders: -     Pulmonary Visit  History of tobacco use -     Pulmonary Visit  Return in about 6 months (around 04/23/2024).   ___________________________________________ Mieka Leaton de [place], MD, ABFM, CAQSM Primary Care and Sports Medicine [facility]

## 2023-10-22 NOTE — Assessment & Plan Note (Signed)
 Patient reports that she has had ongoing cough.  She initially was evaluated in our office at the end of December.  Had x-ray completed at that time which was reassuring.  She indicates that she has had persistent cough.  She has been seen at urgent care more recently and diagnosed with bronchitis.  Cough tends to occur throughout the day without any specific time of day where seems to be worse.  No significant shortness of breath or trouble breathing with ongoing cough. Exam today, lungs clear to auscultation bilaterally, no significant wheezing patient with no increased work of breathing or obvious dyspnea in the office. Given continued symptoms, history of tobacco use, changes noted on prior CT imaging, feel that patient would benefit from further evaluation with pulmonology, patient amenable, referral placed today.

## 2023-10-22 NOTE — Patient Instructions (Signed)

## 2023-10-22 NOTE — Assessment & Plan Note (Signed)
 Routine HCM labs reviewed. HCM reviewed/discussed. Anticipatory guidance regarding healthy weight, lifestyle and choices given. Recommend healthy diet.  Recommend approximately 150 minutes/week of moderate intensity exercise Recommend regular dental and vision exams Always use seatbelt/lap and shoulder restraints Recommend using smoke alarms and checking batteries at least twice a year Recommend using sunscreen when outside Discussed colon cancer screening recommendations Discussed immunization recommendations

## 2023-10-29 ENCOUNTER — Encounter (HOSPITAL_BASED_OUTPATIENT_CLINIC_OR_DEPARTMENT_OTHER): Payer: Self-pay

## 2024-04-23 ENCOUNTER — Encounter (HOSPITAL_BASED_OUTPATIENT_CLINIC_OR_DEPARTMENT_OTHER): Payer: Self-pay | Admitting: Family Medicine

## 2024-04-23 ENCOUNTER — Ambulatory Visit (INDEPENDENT_AMBULATORY_CARE_PROVIDER_SITE_OTHER): Admitting: Family Medicine

## 2024-04-23 VITALS — BP 135/85 | HR 81 | Ht 65.0 in | Wt 125.6 lb

## 2024-04-23 DIAGNOSIS — G629 Polyneuropathy, unspecified: Secondary | ICD-10-CM | POA: Diagnosis not present

## 2024-04-23 DIAGNOSIS — Z23 Encounter for immunization: Secondary | ICD-10-CM | POA: Diagnosis not present

## 2024-04-23 DIAGNOSIS — Z87891 Personal history of nicotine dependence: Secondary | ICD-10-CM

## 2024-04-23 NOTE — Assessment & Plan Note (Signed)
 Patient did have CT scanning of lungs completed for screening and this was generally reassuring.  CT scan with lung RADS 2, recommendation to repeat CT scan for screening in 12 months - she is due at this time.  Incidental findings of aortic atherosclerosis and emphysema noted by radiologist. She has also previously been referred to pulmonology, has not arranged appointment as of yet.  Provided patient with contact information today

## 2024-04-23 NOTE — Progress Notes (Signed)
    Procedures performed today:    None.  Independent interpretation of notes and tests performed by another provider:   None.  Brief History, Exam, Impression, and Recommendations:    BP 135/85 (BP Location: Right Arm, Patient Position: Sitting, Cuff Size: Normal)   Pulse 81   Ht 5' 5 (1.651 m)   Wt 125 lb 9.6 oz (57 kg)   LMP 07/30/1983   SpO2 96%   BMI 20.90 kg/m   History of tobacco use Assessment & Plan: Patient did have CT scanning of lungs completed for screening and this was generally reassuring.  CT scan with lung RADS 2, recommendation to repeat CT scan for screening in 12 months - she is due at this time.  Incidental findings of aortic atherosclerosis and emphysema noted by radiologist. She has also previously been referred to pulmonology, has not arranged appointment as of yet.  Provided patient with contact information today  Orders: -     CT CHEST LUNG CANCER SCREENING LOW DOSE WO CONTRAST; Future  Encounter for immunization -     Flu vaccine HIGH DOSE PF(Fluzone Trivalent)  Neuropathy Assessment & Plan: Continues to have neuropathy symptoms, feels that they have become progressively worse/more noticeable.  She last saw her neurologist about 8 months ago.  No appointment scheduled currently, however she does think that she will schedule in the near future if it is not already scheduled.  Recommend reaching out to neurology office to schedule follow-up appointment   Return in about 6 months (around 10/21/2024).   ___________________________________________ Christine Laubacher de [place], MD, ABFM, CAQSM Primary Care and Sports Medicine [facility]

## 2024-04-23 NOTE — Patient Instructions (Signed)

## 2024-04-23 NOTE — Assessment & Plan Note (Signed)
 Continues to have neuropathy symptoms, feels that they have become progressively worse/more noticeable.  She last saw her neurologist about 8 months ago.  No appointment scheduled currently, however she does think that she will schedule in the near future if it is not already scheduled.  Recommend reaching out to neurology office to schedule follow-up appointment

## 2024-04-26 ENCOUNTER — Telehealth (HOSPITAL_BASED_OUTPATIENT_CLINIC_OR_DEPARTMENT_OTHER): Payer: Self-pay | Admitting: *Deleted

## 2024-04-26 DIAGNOSIS — R918 Other nonspecific abnormal finding of lung field: Secondary | ICD-10-CM

## 2024-04-26 DIAGNOSIS — Z87891 Personal history of nicotine dependence: Secondary | ICD-10-CM

## 2024-04-26 NOTE — Telephone Encounter (Signed)
 Copied from CRM [id number]. Topic: Clinical - Lab/Test Results >> Apr 23, 2024  2:52 PM Nathanel BROCKS wrote: Reason for CRM: Sonny with [employer] called and stated that her ins will not cover a ct lung screening to change it to a ct chest without. Please fax over once changed. [phone]

## 2024-04-26 NOTE — Telephone Encounter (Signed)
**Note De-identified  [place] Obfuscation** Please advise 

## 2024-04-26 NOTE — Telephone Encounter (Signed)
 Patient advised with verbal understanding.  Patient will reach out to insurance company and find out why this is not being covered before proceeding with other order.

## 2024-04-26 NOTE — Addendum Note (Signed)
 Addended by: DE PERU, Izola Teague J on: 04/26/2024 08:53 AM   Modules accepted: Orders

## 2024-04-29 ENCOUNTER — Telehealth: Payer: Self-pay | Admitting: Neurology

## 2024-04-29 NOTE — Telephone Encounter (Signed)
 Pt called in today and stated that her neuropathy is getting bad. Thanks

## 2024-04-30 ENCOUNTER — Ambulatory Visit
Admission: RE | Admit: 2024-04-30 | Discharge: 2024-04-30 | Disposition: A | Discharge status: Home / Self Care | Source: Ambulatory Visit | Attending: Family Medicine | Admitting: Family Medicine

## 2024-04-30 DIAGNOSIS — Z87891 Personal history of nicotine dependence: Secondary | ICD-10-CM

## 2024-04-30 DIAGNOSIS — R918 Other nonspecific abnormal finding of lung field: Secondary | ICD-10-CM

## 2024-04-30 NOTE — Progress Notes (Signed)
 I saw NAKYLA BRACCO in neurology clinic on 05/05/24 in follow up for numbness and tingling in feet.  HPI: AMINA MENCHACA is a 78 y.o. year old female with a history of HLD, OA who we last saw on 08/27/23.  To briefly review: 02/19/23: Patient has had symptoms for a couple of years. She first noticed symptoms while in bed, thinking she still had her socks on, but she didn't. She had some numbness and tingling in her feet. Over the years, symptoms do feel more pronounced. She still thinks her symptoms are only in her feet or maybe into lower legs. She denies weakness in her legs, imbalance, or falls. She gets occasional cramps in her legs at night, but has been better lately as she is drinking more water. She denies symptoms in her arms.   The patient denies symptoms suggestive of oculobulbar weakness including diplopia, ptosis, poor saliva control, dysarthria/dysphonia, impaired mastication, facial weakness/droop. She does mention that occasionally feeling like she has difficulty swallowing. She has had a swallowing test that was normal, per her report. She was treated for reflux with some improvement.   There are no neuromuscular respiratory weakness symptoms, particularly orthopnea>dyspnea.    The patient does not report symptoms referable to autonomic dysfunction including impaired sweating, heat or cold intolerance, excessive mucosal dryness, postprandial abdominal bloating, constipation, bowel or bladder dyscontrol, or syncope/presyncope/orthostatic intolerance. She does endorse some abdominal bloating.   She report any constitutional symptoms like fever, night sweats, anorexia or unintentional weight loss.   Patient is on a lot of supplements including vitamin D, B12 because she wants to be healthy (not previously deficient).   EtOH use: rare, socially (once every couple of month)  Restrictive diet? Does not eat as much as previous Family history of neuropathy/myopathy/neurologic  disease? No  08/27/23: Labs showed a mildly elevated B6, but otherwise normal. She thinks symptoms are similar to prior, maybe slightly worse. It is still tolerable. It does not stop her from doing anything. She denies imbalance or falls.   She takes alpha lipoic acid sometimes. She did not try lidocaine  cream.   She has no new issues to discuss today.  Most recent Assessment and Plan (08/27/23): This is LITHA LAMARTINA, a 78 y.o. female with numbness and tingling in her feet, consistent with a very mild distal symmetric polyneuropathy. She has very mild pre-DM but no other known risk factors. Her symptoms are currently mild and unchanged from prior.   Plan: -Alpha lipoic acid 600 mg once or twice daily -Lidocaine  cream PRN -Again discussed neuropathic pain medications, patient declined -Fall precautions discussed  Since their last visit: Patient is noticing more numbness and tingling over the last couple of months. She mentions some imbalance and difficulty feeling her feet on the ground. She denies back pain. She is not taking anything and not using lidocaine  cream and only taking alpha lipoic acid regularly. While it is present, it does not significantly bother her or cause her to lose sleep.  She denies any recent falls.  Of note, her husband, who I also previously saw, passed away in 09/19/2023. She lives alone but has family in the area.   MEDICATIONS:  Outpatient Encounter Medications as of 05/05/2024  Medication Sig   Apoaequorin (PREVAGEN PO) Take 1 tablet by mouth daily.   Ascorbic Acid (VITAMIN C) 1000 MG tablet Take 1,000 mg by mouth daily.   calcium carbonate (OS-CAL) 600 MG TABS tablet Take 600 mg by mouth 2 (two)  times daily with a meal.   Cholecalciferol (VITAMIN D3) 1000 units CAPS Take 1 capsule by mouth daily.   cyanocobalamin  (VITAMIN B12) 1000 MCG tablet Take 1,000 mcg by mouth daily.   KRILL OIL PO Take 1 tablet by mouth daily.   mirabegron  ER (MYRBETRIQ ) 25 MG TB24  tablet Take 1 tablet (25 mg total) by mouth daily.   Multiple Vitamin (MULTIVITAMIN) capsule Take 1 capsule by mouth daily.   Probiotic Product (ALIGN PO) Take by mouth.   RESVERATROL PO Take 1 tablet by mouth daily.   thiamine (VITAMIN B-1) 100 MG tablet Take 100 mg by mouth daily.   albuterol  (VENTOLIN  HFA) 108 (90 Base) MCG/ACT inhaler Inhale 2 puffs into the lungs every 6 (six) hours as needed for wheezing or shortness of breath. (Patient not taking: Reported on 05/05/2024)   aspirin EC 81 MG tablet Take 81 mg by mouth daily. (Patient not taking: Reported on 05/05/2024)   doxylamine, Sleep, (UNISOM) 25 MG tablet Take 25 mg by mouth at bedtime as needed for sleep. (Patient not taking: Reported on 05/05/2024)   ibuprofen  (ADVIL ,MOTRIN ) 800 MG tablet Take 1 tablet (800 mg total) by mouth every 8 (eight) hours as needed. (Patient not taking: Reported on 05/05/2024)   ipratropium-albuterol  (DUONEB) 0.5-2.5 (3) MG/3ML SOLN Take 3 mLs by nebulization every 6 (six) hours as needed (shortness of breath and cough). (Patient not taking: Reported on 05/05/2024)   Multiple Vitamins-Minerals (MULTIVITAMIN WITH MINERALS) tablet Take 1 tablet by mouth daily. (Patient not taking: Reported on 05/05/2024)   pantoprazole  (PROTONIX ) 40 MG tablet Take 1 tablet (40 mg total) by mouth daily. (Patient not taking: Reported on 05/05/2024)   No facility-administered encounter medications on file as of 05/05/2024.    PAST MEDICAL HISTORY: Past Medical History:  Diagnosis Date   Arthritis    hands   Breast lump    Dyspareunia    Osteoporosis    SVD (spontaneous vaginal delivery)    x 3    PAST SURGICAL HISTORY: Past Surgical History:  Procedure Laterality Date   BREAST SURGERY     breast augmentation   COLONOSCOPY     DILATATION & CURETTAGE/HYSTEROSCOPY WITH MYOSURE N/A 05/31/2014   Procedure: DILATATION & CURETTAGE/ DIAGNOSTIC HYSTEROSCOPY;  Surgeon: Bobie FORBES Crown de Charlynn FORBES Cary, MD;  Location: [facility];   Service: Gynecology;  Laterality: N/A;   gardners duct surgery  1972   NASAL SEPTUM SURGERY  1980's   POLYPECTOMY     WISDOM TOOTH EXTRACTION      ALLERGIES: Allergies  Allergen Reactions   Talwin [Pentazocine] Other (See Comments)    hallucinations    FAMILY HISTORY: Family History  Problem Relation Age of Onset   Stroke Mother 49       dec   Diabetes Mother    Cancer Father 72       dec colon ca   Colon cancer Father    Other Brother 46       dec-MVA   Multiple sclerosis Sister    Multiple sclerosis Sister    Colitis Sister    Stroke Maternal Aunt    Rectal cancer Neg Hx    Stomach cancer Neg Hx     SOCIAL HISTORY: Social History   Tobacco Use   Smoking status: Former    Current packs/day: 0.00    Average packs/day: 1 pack/day for 50.0 years (50.0 ttl pk-yrs)    Types: Cigarettes    Start date: 1964    Quit date:  2014    Years since quitting: 11.7    Passive exposure: Past   Smokeless tobacco: Never  Vaping Use   Vaping status: Never Used  Substance Use Topics   Alcohol use: No    Alcohol/week: 0.0 standard drinks of alcohol    Comment: socially   Drug use: No   Social History   Social History Narrative   Are you right handed or left handed? right   Are you currently employed ? no   What is your current occupation?   Do you live at home alone?no   Who lives with you? husband   What type of home do you live in: 1 story or 2 story? one   Caffeine  0ne cup a day    Objective:  Vital Signs:  BP 128/78   Pulse 83   Ht 5' 5 (1.651 m)   Wt 126 lb (57.2 kg)   LMP 07/30/1983   SpO2 98%   BMI 20.97 kg/m   General: General appearance: Awake and alert. No distress. Cooperative with exam.  Skin: No obvious rash or jaundice. HEENT: Atraumatic. Anicteric. Lungs: Non-labored breathing on room air  Heart: Regular Abdomen: Soft, non tender. Extremities: No edema. No obvious deformity.  Musculoskeletal: No obvious joint  swelling.  Neurological: Mental Status: Alert. Speech fluent. No pseudobulbar affect Cranial Nerves: CNII: No RAPD. Visual fields intact. CNIII, IV, VI: PERRL. No nystagmus. EOMI. CN V: Facial sensation intact bilaterally to fine touch. CN VII: Facial muscles symmetric and strong. No ptosis at rest. CN VIII: Hears finger rub well bilaterally. CN IX: No hypophonia. CN X: Palate elevates symmetrically. CN XI: Full strength shoulder shrug bilaterally. CN XII: Tongue protrusion full and midline. No atrophy or fasciculations. No significant dysarthria Motor: Tone is normal. Strength is 5/5 in bilateral upper and lower extremities. Reflexes:  Right Left  Bicep 2+ 2+  Tricep 2+ 2+  BrRad 2+ 2+  Knee 2+ 2+  Ankle Trace Trace   Pathological Reflexes: Babinski: flexor response bilaterally Hoffman: absent bilaterally Troemner: absent bilaterally Sensation: Pinprick: Diminished in length dependent fashion in feet to lower ankles, otherwise intact Vibration: Diminished in bilateral great toes and ankles, otherwise intact Proprioception: Intact in bilateral great toes. Coordination: Intact finger-to- nose-finger and heel-to-shin bilaterally. Romberg negative. Gait: Able to rise from chair with arms crossed unassisted. Normal, narrow-based gait.    Lab and Test Review: New results: 10/08/23: CBC w/ diff unremarkable CMP significant for glucose 125 HbA1c: 5.7 Lipid panel: tChol 193, LDL 102, TG 84 TSH wnl  Previously reviewed results: 02/19/23: IFE no M protein B1 wnl B12: 568 B6: 23.2 (21.7 is the upper limit of normal)   10/08/22: HbA1c: 5.7 TSH: 2.150  ASSESSMENT: This is Darice JINNY Caro, a 78 y.o. female with numbness and tingling in her feet, consistent with a very mild distal symmetric polyneuropathy. She has very mild pre-DM but no other known risk factors. Her symptoms have increased some over the last few months. Her exam is unchanged from prior. I will recheck labs  to ensure nothing has changed in regards to treatable causes. We discussed medication such as gabapentin, but patient was not interested today as symptoms do not significantly bother her.  Plan: -Blood work: B12, IFE -Again discussed neuropathic pain medication but patient deferred -Lidocaine  cream PRN -Alpha lipoic acid 600 mg once or twice daily -Fall precautions discussed  Return to clinic in 6 months  Total time spent reviewing records, interview, history/exam, documentation, and coordination of care  on day of encounter:  45 min  Venetia Potters, MD

## 2024-04-30 NOTE — Telephone Encounter (Signed)
 Called and informed pt of Dr. Leigh answer. She understood and had no more questions or concerns.

## 2024-05-04 ENCOUNTER — Ambulatory Visit (HOSPITAL_BASED_OUTPATIENT_CLINIC_OR_DEPARTMENT_OTHER): Payer: Self-pay | Admitting: Family Medicine

## 2024-05-04 NOTE — Progress Notes (Signed)
 "  New Patient Pulmonology Office Visit   Subjective:  Patient ID: Christine Romero, female    DOB: 1945/11/01  MRN: [id number]  Referred by: de [place], Raymond J, MD  CC:  Chief Complaint  Patient presents with   Establish Care    Pulmonary nodules    HPI Christine Romero is a 78 y.o. female with nicotine dependence and emphysema who is here to establish care.   Discussed the use of AI scribe software for clinical note transcription with the patient, who gave verbal consent to proceed.  History of Present Illness Christine Romero is an 78 year old female with mild emphysema who presents with concerns about lung scarring.  She recently underwent a CT scan that revealed scar tissue at the apex of her lungs. She is concerned about a spot on her lung that she believed was being monitored, but was informed that there are no concerning spots.  She experiences exertional dyspnea, such as when climbing stairs or performing household chores, which improves with rest. She has had bronchitis twice, once earlier this year and once last year, and was treated with antibiotics and prednisone , which she believes were effective.  She has a history of mild emphysema and a history of past smoking. She does not currently use any medications for her lung condition, although she has used albuterol  in the past but does not recall its effectiveness.  No current respiratory problems such as breathing difficulties, coughing, or wheezing. She also denies any allergies to antibiotics.  Her past medical history includes neuropathy, for which she does not take medication. She occasionally takes Mirabectric and has used Protonix  for reflux in the past but is currently out of it.  She worked in an office setting and denies any occupational exposure to asbestos or other lung irritants. She does not have pets or birds at home and occasionally uses microwave popcorn when her grandson visits.  CAT < 10, MMRC < 2, No  recurrent exacerbations  ROS  Allergies: Talwin [pentazocine]  Current Outpatient Medications:    Apoaequorin (PREVAGEN PO), Take 1 tablet by mouth daily., Disp: , Rfl:    Ascorbic Acid (VITAMIN C) 1000 MG tablet, Take 1,000 mg by mouth daily., Disp: , Rfl:    aspirin EC 81 MG tablet, Take 81 mg by mouth daily., Disp: , Rfl:    calcium carbonate (OS-CAL) 600 MG TABS tablet, Take 600 mg by mouth 2 (two) times daily with a meal., Disp: , Rfl:    Cholecalciferol (VITAMIN D3) 1000 units CAPS, Take 1 capsule by mouth daily., Disp: , Rfl:    cyanocobalamin  (VITAMIN B12) 1000 MCG tablet, Take 1,000 mcg by mouth daily., Disp: , Rfl:    doxylamine, Sleep, (UNISOM) 25 MG tablet, Take 25 mg by mouth at bedtime as needed for sleep., Disp: , Rfl:    ipratropium-albuterol  (DUONEB) 0.5-2.5 (3) MG/3ML SOLN, Take 3 mLs by nebulization every 6 (six) hours as needed (shortness of breath and cough)., Disp: 360 mL, Rfl: 1   KRILL OIL PO, Take 1 tablet by mouth daily., Disp: , Rfl:    mirabegron  ER (MYRBETRIQ ) 25 MG TB24 tablet, Take 1 tablet (25 mg total) by mouth daily., Disp: 90 tablet, Rfl: 1   Multiple Vitamin (MULTIVITAMIN) capsule, Take 1 capsule by mouth daily., Disp: , Rfl:    Multiple Vitamins-Minerals (MULTIVITAMIN WITH MINERALS) tablet, Take 1 tablet by mouth daily., Disp: , Rfl:    Probiotic Product (ALIGN PO), Take by mouth., Disp: ,  Rfl:    RESVERATROL PO, Take 1 tablet by mouth daily., Disp: , Rfl:    thiamine (VITAMIN B-1) 100 MG tablet, Take 100 mg by mouth daily., Disp: , Rfl:    albuterol  (VENTOLIN  HFA) 108 (90 Base) MCG/ACT inhaler, Inhale 2 puffs into the lungs every 6 (six) hours as needed for wheezing or shortness of breath. (Patient not taking: Reported on 05/05/2024), Disp: 8.5 g, Rfl: 2   ibuprofen  (ADVIL ,MOTRIN ) 800 MG tablet, Take 1 tablet (800 mg total) by mouth every 8 (eight) hours as needed. (Patient not taking: Reported on 05/05/2024), Disp: 30 tablet, Rfl: 0   pantoprazole   (PROTONIX ) 40 MG tablet, Take 1 tablet (40 mg total) by mouth daily. (Patient not taking: Reported on 05/05/2024), Disp: 90 tablet, Rfl: 1 Past Medical History:  Diagnosis Date   Arthritis    hands   Breast lump    Dyspareunia    Osteoporosis    SVD (spontaneous vaginal delivery)    x 3   Past Surgical History:  Procedure Laterality Date   BREAST SURGERY     breast augmentation   COLONOSCOPY     DILATATION & CURETTAGE/HYSTEROSCOPY WITH MYOSURE N/A 05/31/2014   Procedure: DILATATION & CURETTAGE/ DIAGNOSTIC HYSTEROSCOPY;  Surgeon: Bobie FORBES Crown de Charlynn FORBES Cary, MD;  Location: [facility];  Service: Gynecology;  Laterality: N/A;   gardners duct surgery  1972   NASAL SEPTUM SURGERY  1980's   POLYPECTOMY     WISDOM TOOTH EXTRACTION     Family History  Problem Relation Age of Onset   Stroke Mother 64       dec   Diabetes Mother    Cancer Father 36       dec colon ca   Colon cancer Father    Other Brother 58       dec-MVA   Multiple sclerosis Sister    Multiple sclerosis Sister    Colitis Sister    Stroke Maternal Aunt    Rectal cancer Neg Hx    Stomach cancer Neg Hx    Social History   Socioeconomic History   Marital status: Married    Spouse name: Not on file   Number of children: Not on file   Years of education: Not on file   Highest education level: Not on file  Occupational History   Not on file  Tobacco Use   Smoking status: Former    Current packs/day: 0.00    Average packs/day: 1 pack/day for 50.0 years (50.0 ttl pk-yrs)    Types: Cigarettes    Start date: 5    Quit date: 2014    Years since quitting: 11.7    Passive exposure: Past   Smokeless tobacco: Never  Vaping Use   Vaping status: Never Used  Substance and Sexual Activity   Alcohol use: No    Alcohol/week: 0.0 standard drinks of alcohol    Comment: socially   Drug use: No   Sexual activity: Not Currently    Partners: Male    Birth control/protection: Post-menopausal    Comment:  postmenopausal  Other Topics Concern   Not on file  Social History Narrative   Are you right handed or left handed? right   Are you currently employed ? no   What is your current occupation?   Do you live at home alone?no   Who lives with you? husband   What type of home do you live in: 1 story or 2 story? one  Caffeine  0ne cup a day   Social Drivers of Health   Financial Resource Strain: Low Risk  (10/14/2023)   Overall Financial Resource Strain (CARDIA)    Difficulty of Paying Living Expenses: Not hard at all  Food Insecurity: No Food Insecurity (10/14/2023)   Hunger Vital Sign    Worried About Running Out of Food in the Last Year: Never true    Ran Out of Food in the Last Year: Never true  Transportation Needs: No Transportation Needs (10/14/2023)   PRAPARE - Administrator, Civil Service (Medical): No    Lack of Transportation (Non-Medical): No  Physical Activity: Inactive (10/14/2023)   Exercise Vital Sign    Days of Exercise per Week: 0 days    Minutes of Exercise per Session: 0 min  Stress: No Stress Concern Present (10/14/2023)   [employer] of Occupational Health - Occupational Stress Questionnaire    Feeling of Stress : Not at all  Social Connections: Socially Integrated (10/14/2023)   Social Connection and Isolation Panel    Frequency of Communication with Friends and Family: More than three times a week    Frequency of Social Gatherings with Friends and Family: More than three times a week    Attends Religious Services: More than 4 times per year    Active Member of Clubs or Organizations: Yes    Attends Banker Meetings: More than 4 times per year    Marital Status: Married  Catering Manager Violence: Not At Risk (10/14/2023)   Humiliation, Afraid, Rape, and Kick questionnaire    Fear of Current or Ex-Partner: No    Emotionally Abused: No    Physically Abused: No    Sexually Abused: No       Objective:  BP 110/70   Pulse 81    Ht 5' 5 (1.651 m)   Wt 126 lb 14.4 oz (57.6 kg)   LMP 07/30/1983   SpO2 97%   BMI 21.12 kg/m  Wt Readings from Last 3 Encounters:  05/05/24 126 lb 14.4 oz (57.6 kg)  05/05/24 126 lb (57.2 kg)  04/23/24 125 lb 9.6 oz (57 kg)   BMI Readings from Last 3 Encounters:  05/05/24 21.12 kg/m  05/05/24 20.97 kg/m  04/23/24 20.90 kg/m   SpO2 Readings from Last 3 Encounters:  05/05/24 97%  05/05/24 98%  04/23/24 96%   Physical Exam General: NAD, alert, WD, WN Eyes: PERRL, no scleral icterus ENMT: oropharynx clear, good dentition, no oral lesions, mallampati score I Skin: warm, intact, no rashes Neck: JVD flat, ROM and lymph node assessment normal CV: RRR, no MRG, nl S1 and S2, no peripheral edema Resp: clear to auscultation bilaterally, no wheezes, rales, or rhonchi, normal effort, no clubbing/cyanosis Abdom: Normoactive bowel sounds, soft, nontender, nondistended, no hepatosplenomegaly Neuro: Awake alert oriented to person place time and situation  Diagnostic Review:  Last CBC Lab Results  Component Value Date   WBC 4.6 10/08/2023   HGB 13.8 10/08/2023   HCT 41.7 10/08/2023   MCV 88 10/08/2023   MCH 29.0 10/08/2023   RDW 12.1 10/08/2023   PLT 195 10/08/2023   Last metabolic panel Lab Results  Component Value Date   GLUCOSE 125 (H) 10/08/2023   NA 143 10/08/2023   K 4.0 10/08/2023   CL 104 10/08/2023   CO2 22 10/08/2023   BUN 12 10/08/2023   CREATININE 0.87 10/08/2023   EGFR 68 10/08/2023   CALCIUM 9.0 10/08/2023   PROT 6.4 10/08/2023  ALBUMIN 4.2 10/08/2023   LABGLOB 2.2 10/08/2023   AGRATIO 2.0 10/08/2022   BILITOT 0.5 10/08/2023   ALKPHOS 90 10/08/2023   AST 16 10/08/2023   ALT 10 10/08/2023   ANIONGAP 11 05/12/2014   CT Chest 04/30/24: IMPRESSION: 1. Unchanged biapical pleural-parenchymal scarring and irregular scarring in the medial segment right middle lobe. No suspicious pulmonary nodules. 2. Minimal emphysema and diffuse bilateral bronchial  wall thickening. 3. Coronary artery disease.  LDCT 12/2022: IMPRESSION: 1. Lung-RADS 2, benign appearance or behavior. Continue annual screening with low-dose chest CT without contrast in 12 months. 2. Aortic atherosclerosis (ICD10-I70.0). Left anterior descending coronary artery calcification. 3.  Emphysema (ICD10-J43.9).    Assessment & Plan:   Assessment & Plan Centrilobular emphysema (HCC)  Cigarette nicotine dependence in remission  Abnormal CT of the chest   No orders of the defined types were placed in this encounter.  Assessment and Plan Assessment & Plan Pulmonary apical and right middle lobe scarring Scarring likely age-related or due to past smoking. Stable, not clinically significant. - No further imaging required.  Very mild centrilobular emphysema Very mild emphysema likely from past smoking present on chest imaging. Well-managed, not expected to progress with smoking cessation. MMRC < 2, CAT < 10, no recurrent exacerbations. Class A. Can use Albuterol  MDI as needed. Would defer to primary care for prescription.  History of small right middle lobe pulmonary nodule Prior imaging shows 2 mm RML pulmonary nodule. Per Fleischner criteria, optional imaging in a year. Given patient's age and the likelihood that this is benign, I would not recommend repeat imaging. - No further imaging required. - Reassure regarding stability of findings.  Nicotine dependence, in remission Nicotine dependence in remission. Continued abstinence advised to prevent lung condition progression. - Encourage continued abstinence from smoking.  I spent 30 minutes reviewing patient's chart including prior consultant notes, imaging, and PFTs as well as face-to-face with the patient, over half in discussion of the diagnosis and the importance of compliance with the treatment plan.  No follow-ups on file.   Ellwood Steidle, MD "

## 2024-05-05 ENCOUNTER — Encounter: Payer: Self-pay | Admitting: Neurology

## 2024-05-05 ENCOUNTER — Ambulatory Visit: Admitting: Neurology

## 2024-05-05 ENCOUNTER — Encounter (HOSPITAL_BASED_OUTPATIENT_CLINIC_OR_DEPARTMENT_OTHER): Payer: Self-pay | Admitting: Pulmonary Disease

## 2024-05-05 ENCOUNTER — Other Ambulatory Visit

## 2024-05-05 ENCOUNTER — Ambulatory Visit (INDEPENDENT_AMBULATORY_CARE_PROVIDER_SITE_OTHER): Admitting: Pulmonary Disease

## 2024-05-05 VITALS — BP 110/70 | HR 81 | Ht 65.0 in | Wt 126.9 lb

## 2024-05-05 VITALS — BP 128/78 | HR 83 | Ht 65.0 in | Wt 126.0 lb

## 2024-05-05 DIAGNOSIS — R9389 Abnormal findings on diagnostic imaging of other specified body structures: Secondary | ICD-10-CM

## 2024-05-05 DIAGNOSIS — G629 Polyneuropathy, unspecified: Secondary | ICD-10-CM

## 2024-05-05 DIAGNOSIS — J432 Centrilobular emphysema: Secondary | ICD-10-CM | POA: Diagnosis not present

## 2024-05-05 DIAGNOSIS — R7303 Prediabetes: Secondary | ICD-10-CM

## 2024-05-05 DIAGNOSIS — F17211 Nicotine dependence, cigarettes, in remission: Secondary | ICD-10-CM | POA: Diagnosis not present

## 2024-05-05 NOTE — Patient Instructions (Signed)
  VISIT SUMMARY: You visited us  today with concerns about lung scarring. We reviewed your recent CT scan and discussed your history of mild emphysema and past smoking.  YOUR PLAN: PULMONARY APICAL AND RIGHT MIDDLE LOBE SCARRING: The scarring in your lungs is likely due to age or past smoking and is stable and not clinically significant. -No further imaging is required at this time.  VERY MILD CENTRILOBULAR EMPHYSEMA: Your very mild emphysema is likely from past smoking and is well-managed. -It is not expected to progress as long as you continue to abstain from smoking.  HISTORY OF SMALL RIGHT MIDDLE LOBE PULMONARY NODULE: The previous nodule is not visible on your current imaging, indicating no malignancy or change. -No further imaging is required. -We reassure you that the findings are stable.  NICOTINE DEPENDENCE, IN REMISSION: Your nicotine dependence is in remission. -Continue to abstain from smoking to prevent progression of your lung condition.                      Contains text generated by [employer]bridge.                                 Contains text generated by Abridge.

## 2024-05-05 NOTE — Patient Instructions (Signed)
 I saw you for neuropathy (nerve damage in your feet) today.  I would like to check a couple of labs today to look for treatable causes of neuropathy. I will be in touch when I have those results.  We discussed nerve pain medication but you would like to hold off for now. Let me know if you ever change your mind about this.  You can also try Lidocaine  cream as needed. Apply wear you have pain, tingling, or burning. Wear gloves to prevent your hands being numb. This can be bought over the counter at any drug store or online.   Alpha lipoic acid 600mg  daily has some research data suggesting it helps with nerve health. No major side effects other than <1% of people report upset stomach. This can be taken twice per day (1200mg  daily) if no relief obtained. You can buy this over the counter or online.  I will see you again in 6 months.  The physicians and staff at [facility] are committed to providing excellent care. You may receive a survey requesting feedback about your experience at our office. We strive to receive very good responses to the survey questions. If you feel that your experience would prevent you from giving the office a very good  response, please contact our office to try to remedy the situation. We may be reached at [phone]. Thank you for taking the time out of your busy day to complete the survey.  Venetia Potters, MD [facility]  Preventing Falls at [facility] are common, often dreaded events in the lives of older people. Aside from the obvious injuries and even death that may result, fall can cause wide-ranging consequences including loss of independence, mental decline, decreased activity and mobility. Younger people are also at risk of falling, especially those with chronic illnesses and fatigue.  Ways to reduce risk for falling Examine diet and medications. Warm foods and alcohol dilate blood vessels, which can lead to dizziness when standing. Sleep aids,  antidepressants and pain medications can also increase the likelihood of a fall.  Get a vision exam. Poor vision, cataracts and glaucoma increase the chances of falling.  Check foot gear. Shoes should fit snugly and have a sturdy, nonskid sole and a broad, low heel  Participate in a physician-approved exercise program to build and maintain muscle strength and improve balance and coordination. Programs that use ankle weights or stretch bands are excellent for muscle-strengthening. Water aerobics programs and low-impact Tai Chi programs have also been shown to improve balance and coordination.  Increase vitamin D intake. Vitamin D improves muscle strength and increases the amount of calcium the body is able to absorb and deposit in bones.  How to prevent falls from common hazards Floors - Remove all loose wires, cords, and throw rugs. Minimize clutter. Make sure rugs are anchored and smooth. Keep furniture in its usual place.  Chairs -- Use chairs with straight backs, armrests and firm seats. Add firm cushions to existing pieces to add height.  Bathroom - Install grab bars and non-skid tape in the tub or shower. Use a bathtub transfer bench or a shower chair with a back support Use an elevated toilet seat and/or safety rails to assist standing from a low surface. Do not use towel racks or bathroom tissue holders to help you stand.  Lighting - Make sure halls, stairways, and entrances are well-lit. Install a night light in your bathroom or hallway. Make sure there is a light switch at the  top and bottom of the staircase. Turn lights on if you get up in the middle of the night. Make sure lamps or light switches are within reach of the bed if you have to get up during the night.  Kitchen - Install non-skid rubber mats near the sink and stove. Clean spills immediately. Store frequently used utensils, pots, pans between waist and eye level. This helps prevent reaching and bending. Sit when getting things  out of lower cupboards.  Living room/ Bedrooms - Place furniture with wide spaces in between, giving enough room to move around. Establish a route through the living room that gives you something to hold onto as you walk.  Stairs - Make sure treads, rails, and rugs are secure. Install a rail on both sides of the stairs. If stairs are a threat, it might be helpful to arrange most of your activities on the lower level to reduce the number of times you must climb the stairs.  Entrances and doorways - Install metal handles on the walls adjacent to the doorknobs of all doors to make it more secure as you travel through the doorway.  Tips for maintaining balance Keep at least one hand free at all times. Try using a backpack or fanny pack to hold things rather than carrying them in your hands. Never carry objects in both hands when walking as this interferes with keeping your balance.  Attempt to swing both arms from front to back while walking. This might require a conscious effort if Parkinson's disease has diminished your movement. It will, however, help you to maintain balance and posture, and reduce fatigue.  Consciously lift your feet off of the ground when walking. Shuffling and dragging of the feet is a common culprit in losing your balance.  When trying to navigate turns, use a U technique of facing forward and making a wide turn, rather than pivoting sharply.  Try to stand with your feet shoulder-length apart. When your feet are close together for any length of time, you increase your risk of losing your balance and falling.  Do one thing at a time. Don't try to walk and accomplish another task, such as reading or looking around. The decrease in your automatic reflexes complicates motor function, so the less distraction, the better.  Do not wear rubber or gripping soled shoes, they might catch on the floor and cause tripping.  Move slowly when changing positions. Use deliberate,  concentrated movements and, if needed, use a grab bar or walking aid. Count 15 seconds between each movement. For example, when rising from a seated position, wait 15 seconds after standing to begin walking.  If balance is a continuous problem, you might want to consider a walking aid such as a cane, walking stick, or walker. Once you've mastered walking with help, you might be ready to try it on your own again.

## 2024-05-14 ENCOUNTER — Ambulatory Visit: Payer: Self-pay | Admitting: Neurology

## 2024-05-14 LAB — VITAMIN B12: Vitamin B-12: 568 pg/mL (ref 200–1100)

## 2024-05-14 LAB — IMMUNOFIXATION ELECTROPHORESIS
IgG (Immunoglobin G), Serum: 1073 mg/dL (ref 600–1540)
IgM, Serum: 72 mg/dL (ref 50–300)
Immunoglobulin A: 238 mg/dL (ref 70–320)

## 2024-07-07 ENCOUNTER — Other Ambulatory Visit (HOSPITAL_BASED_OUTPATIENT_CLINIC_OR_DEPARTMENT_OTHER): Payer: Self-pay

## 2024-07-17 ENCOUNTER — Encounter (HOSPITAL_BASED_OUTPATIENT_CLINIC_OR_DEPARTMENT_OTHER): Payer: Self-pay

## 2024-07-17 ENCOUNTER — Emergency Department (HOSPITAL_BASED_OUTPATIENT_CLINIC_OR_DEPARTMENT_OTHER)
Admission: EM | Admit: 2024-07-17 | Discharge: 2024-07-17 | Disposition: A | Discharge status: Home / Self Care | Attending: Emergency Medicine | Admitting: Emergency Medicine

## 2024-07-17 ENCOUNTER — Other Ambulatory Visit (HOSPITAL_BASED_OUTPATIENT_CLINIC_OR_DEPARTMENT_OTHER): Payer: Self-pay

## 2024-07-17 ENCOUNTER — Emergency Department (HOSPITAL_BASED_OUTPATIENT_CLINIC_OR_DEPARTMENT_OTHER): Admitting: Radiology

## 2024-07-17 ENCOUNTER — Other Ambulatory Visit: Payer: Self-pay

## 2024-07-17 DIAGNOSIS — Z7982 Long term (current) use of aspirin: Secondary | ICD-10-CM | POA: Diagnosis not present

## 2024-07-17 DIAGNOSIS — J40 Bronchitis, not specified as acute or chronic: Secondary | ICD-10-CM | POA: Insufficient documentation

## 2024-07-17 DIAGNOSIS — R059 Cough, unspecified: Secondary | ICD-10-CM | POA: Diagnosis present

## 2024-07-17 LAB — RESP PANEL BY RT-PCR (RSV, FLU A&B, COVID)  RVPGX2
Influenza A by PCR: NEGATIVE
Influenza B by PCR: NEGATIVE
Resp Syncytial Virus by PCR: NEGATIVE
SARS Coronavirus 2 by RT PCR: NEGATIVE

## 2024-07-17 MED ORDER — ALBUTEROL SULFATE HFA 108 (90 BASE) MCG/ACT IN AERS
2.0000 | INHALATION_SPRAY | Freq: Once | RESPIRATORY_TRACT | Status: AC
  Administered 2024-07-17: 2 via RESPIRATORY_TRACT
  Filled 2024-07-17: qty 6.7

## 2024-07-17 MED ORDER — PREDNISONE 20 MG PO TABS
60.0000 mg | ORAL_TABLET | Freq: Every day | ORAL | 0 refills | Status: AC
  Filled 2024-07-17: qty 15, 5d supply, fill #0

## 2024-07-17 MED ORDER — AZITHROMYCIN 250 MG PO TABS
250.0000 mg | ORAL_TABLET | Freq: Every day | ORAL | 0 refills | Status: AC
  Filled 2024-07-17: qty 6, 5d supply, fill #0

## 2024-07-17 NOTE — Discharge Instructions (Addendum)
 return if symptoms worsen.  Take antibiotic and steroids as prescribed.  Use inhaler as needed.

## 2024-07-17 NOTE — ED Notes (Signed)
 Reviewed discharge instructions, medications, and home care with pt. Pt verbalized understanding and had no further questions. Pt exited ED without complications.

## 2024-07-17 NOTE — ED Notes (Signed)
 RT at bedside.

## 2024-07-17 NOTE — ED Provider Notes (Signed)
 " [place] [facility] AT [facility] Provider Note   CSN: [id number] Arrival date & time: 07/17/24  1235     Patient presents with: No chief complaint on file.   Christine Romero is a 78 y.o. female.   Patient here with cough.  History of bronchitis.  No chest pain no fever no shortness of breath.  Nasal congestion.  History of arthritis.  She has not had any weakness numbness tingling.  No exertional chest pain.  She has had a dry harsh cough for the last couple days.  She does not think she has had a fever.  No obvious sick contacts.  Has used inhaler.  The history is provided by the patient.       Prior to Admission medications  Medication Sig Start Date End Date Taking? Authorizing Provider  azithromycin  (ZITHROMAX ) 250 MG tablet Take 1 tablet (250 mg total) by mouth daily. Take first 2 tablets together, then 1 every day until finished. 07/17/24  Yes Maxxwell Edgett, DO  predniSONE  (DELTASONE ) 20 MG tablet Take 3 tablets (60 mg total) by mouth daily for 5 days. 07/17/24 07/22/24 Yes Sameen Leas, DO  albuterol  (VENTOLIN  HFA) 108 (90 Base) MCG/ACT inhaler Inhale 2 puffs into the lungs every 6 (six) hours as needed for wheezing or shortness of breath. Patient not taking: Reported on 05/05/2024 07/25/23   Knute Thersia Bitters, FNP  Apoaequorin (PREVAGEN PO) Take 1 tablet by mouth daily.    [provider]  Ascorbic Acid (VITAMIN C) 1000 MG tablet Take 1,000 mg by mouth daily.    [provider]  aspirin EC 81 MG tablet Take 81 mg by mouth daily.    [provider]  calcium carbonate (OS-CAL) 600 MG TABS tablet Take 600 mg by mouth 2 (two) times daily with a meal.    [provider]  Cholecalciferol (VITAMIN D3) 1000 units CAPS Take 1 capsule by mouth daily.    [provider]  cyanocobalamin  (VITAMIN B12) 1000 MCG tablet Take 1,000 mcg by mouth daily.    [provider]  doxylamine, Sleep, (UNISOM) 25 MG  tablet Take 25 mg by mouth at bedtime as needed for sleep.    [provider]  ibuprofen  (ADVIL ,MOTRIN ) 800 MG tablet Take 1 tablet (800 mg total) by mouth every 8 (eight) hours as needed. Patient not taking: Reported on 05/05/2024 05/31/14   Amundson C Silva, Brook E, MD  ipratropium-albuterol  (DUONEB) 0.5-2.5 (3) MG/3ML SOLN Take 3 mLs by nebulization every 6 (six) hours as needed (shortness of breath and cough). 07/29/23   Caudle, Thersia Bitters, FNP  KRILL OIL PO Take 1 tablet by mouth daily.    [provider]  mirabegron  ER (MYRBETRIQ ) 25 MG TB24 tablet Take 1 tablet (25 mg total) by mouth daily. 05/16/23   de [place], Raymond J, MD  Multiple Vitamin (MULTIVITAMIN) capsule Take 1 capsule by mouth daily.    [provider]  Multiple Vitamins-Minerals (MULTIVITAMIN WITH MINERALS) tablet Take 1 tablet by mouth daily.    [provider]  pantoprazole  (PROTONIX ) 40 MG tablet Take 1 tablet (40 mg total) by mouth daily. Patient not taking: Reported on 05/05/2024 10/08/22   de [place], Quintin JINNY, MD  Probiotic Product (ALIGN PO) Take by mouth.    [provider]  RESVERATROL PO Take 1 tablet by mouth daily.    [provider]  thiamine (VITAMIN B-1) 100 MG tablet Take 100 mg by mouth daily.    [provider]    Allergies: Talwin [pentazocine]    Review of Systems  Updated Vital Signs BP 133/82 (BP Location: Left Arm)   Pulse 81   Temp 98.1 F (36.7 C)   Resp 15   LMP 07/30/1983   SpO2 97%   Physical Exam Vitals and nursing note reviewed.  Constitutional:      General: She is not in acute distress.    Appearance: She is well-developed. She is not ill-appearing.  HENT:     Head: Normocephalic and atraumatic.     Mouth/Throat:     Mouth: Mucous membranes are moist.  Eyes:     Extraocular Movements: Extraocular movements intact.     Conjunctiva/sclera: Conjunctivae normal.     Pupils: Pupils are equal, round, and reactive to  light.  Cardiovascular:     Rate and Rhythm: Normal rate and regular rhythm.     Pulses: Normal pulses.     Heart sounds: Normal heart sounds. No murmur heard. Pulmonary:     Effort: Pulmonary effort is normal. No respiratory distress.     Breath sounds: Wheezing (mild end expiratory wheeze) present.  Abdominal:     Palpations: Abdomen is soft.     Tenderness: There is no abdominal tenderness.  Musculoskeletal:        General: No swelling.     Cervical back: Normal range of motion and neck supple.  Skin:    General: Skin is warm and dry.     Capillary Refill: Capillary refill takes less than 2 seconds.  Neurological:     General: No focal deficit present.     Mental Status: She is alert and oriented to person, place, and time.     Cranial Nerves: No cranial nerve deficit.     Sensory: No sensory deficit.     Motor: No weakness.     Coordination: Coordination normal.  Psychiatric:        Mood and Affect: Mood normal.     (all labs ordered are listed, but only abnormal results are displayed) Labs Reviewed  RESP PANEL BY RT-PCR (RSV, FLU A&B, COVID)  [id number]    EKG: None  Radiology: DG Chest 2 View Result Date: 07/17/2024 CLINICAL DATA:  Cough. EXAM: CHEST - 2 VIEW COMPARISON:  Chest radiograph dated 07/29/2023. FINDINGS: Mild diffuse chronic antral coarsening and bronchitic changes. Background of emphysema. No focal consolidation, pleural effusion or pneumothorax. The cardiac silhouette is within normal limits. No acute osseous pathology. Degenerative changes of the spine. Bilateral breast implants. IMPRESSION: 1. No active cardiopulmonary disease. 2. Emphysema. Electronically Signed   By: Vanetta Chou M.D.   On: 07/17/2024 13:41     Procedures   Medications Ordered in the ED  albuterol  (VENTOLIN  HFA) 108 (90 Base) MCG/ACT inhaler 2 puff (has no administration in time range)                                    Medical Decision Making Amount and/or Complexity of  Data Reviewed Radiology: ordered.  Risk Prescription drug management.   Christine Romero is here with cough.  Normal vitals.  No fever.  No chest pain or shortness of breath very well-appearing.  Mild end expiratory wheeze on exam.  Sounds like she has a history of emphysema.  Chest x-ray showed no evidence of pneumonia or pneumothorax.  COVID flu RSV test negative.  I do think she likely has a  viral process or may be some sort of reactive airway process COPD exacerbation.  Will put her on steroids antibiotics and have her continue her inhaler at home.  Have no concern for ACS PE or other acute process.  She is very well-appearing.  Discharge.  Understands turn precautions.  This chart was dictated using voice recognition software.  Despite best efforts to proofread,  errors can occur which can change the documentation meaning.      Final diagnoses:  Bronchitis    ED Discharge Orders          Ordered    azithromycin  (ZITHROMAX ) 250 MG tablet  Daily        07/17/24 1428    predniSONE  (DELTASONE ) 20 MG tablet  Daily        07/17/24 1428               Ruthe Cornet, DO 07/17/24 1430  "

## 2024-07-17 NOTE — ED Triage Notes (Signed)
 She reports uri sx x 3 days. She reports hx of bronchitis.

## 2024-08-21 NOTE — Progress Notes (Signed)
 OK

## 2024-10-21 ENCOUNTER — Encounter (HOSPITAL_BASED_OUTPATIENT_CLINIC_OR_DEPARTMENT_OTHER): Admitting: Family Medicine

## 2024-10-26 ENCOUNTER — Encounter (HOSPITAL_BASED_OUTPATIENT_CLINIC_OR_DEPARTMENT_OTHER)

## 2024-11-10 ENCOUNTER — Ambulatory Visit: Admitting: Neurology
